# Patient Record
Sex: Female | Born: 1955 | Race: White | Hispanic: No | Marital: Married | Smoking: Former smoker
Health system: Southern US, Community
[De-identification: ages and names within clinical notes are randomized; demographics above are authoritative.]

## PROBLEM LIST (undated history)

## (undated) DIAGNOSIS — L292 Pruritus vulvae: Secondary | ICD-10-CM

## (undated) DIAGNOSIS — K222 Esophageal obstruction: Secondary | ICD-10-CM

## (undated) DIAGNOSIS — K760 Fatty (change of) liver, not elsewhere classified: Secondary | ICD-10-CM

## (undated) DIAGNOSIS — Z8489 Family history of other specified conditions: Secondary | ICD-10-CM

## (undated) DIAGNOSIS — E669 Obesity, unspecified: Secondary | ICD-10-CM

## (undated) DIAGNOSIS — I10 Essential (primary) hypertension: Secondary | ICD-10-CM

## (undated) DIAGNOSIS — Z720 Tobacco use: Secondary | ICD-10-CM

## (undated) DIAGNOSIS — K219 Gastro-esophageal reflux disease without esophagitis: Secondary | ICD-10-CM

## (undated) DIAGNOSIS — E785 Hyperlipidemia, unspecified: Secondary | ICD-10-CM

## (undated) DIAGNOSIS — N952 Postmenopausal atrophic vaginitis: Secondary | ICD-10-CM

## (undated) DIAGNOSIS — M352 Behcet's disease: Secondary | ICD-10-CM

## (undated) HISTORY — DX: Behcet's disease: M35.2

## (undated) HISTORY — PX: ABDOMINAL HYSTERECTOMY: SHX81

## (undated) HISTORY — DX: Esophageal obstruction: K22.2

## (undated) HISTORY — DX: Gastro-esophageal reflux disease without esophagitis: K21.9

## (undated) HISTORY — PX: BACK SURGERY: SHX140

## (undated) HISTORY — DX: Pruritus vulvae: L29.2

## (undated) HISTORY — DX: Postmenopausal atrophic vaginitis: N95.2

## (undated) HISTORY — DX: Tobacco use: Z72.0

---

## 2005-07-29 ENCOUNTER — Ambulatory Visit: Payer: Self-pay | Admitting: Podiatry

## 2010-09-09 ENCOUNTER — Ambulatory Visit: Payer: Self-pay | Admitting: Gastroenterology

## 2011-04-09 ENCOUNTER — Ambulatory Visit: Payer: Self-pay | Admitting: Family Medicine

## 2011-10-15 ENCOUNTER — Ambulatory Visit: Payer: Self-pay | Admitting: Family Medicine

## 2011-10-25 ENCOUNTER — Ambulatory Visit: Payer: Self-pay

## 2012-04-14 ENCOUNTER — Ambulatory Visit: Payer: Self-pay | Admitting: Family Medicine

## 2012-10-22 ENCOUNTER — Ambulatory Visit: Payer: Self-pay | Admitting: Orthopedic Surgery

## 2013-02-25 ENCOUNTER — Other Ambulatory Visit: Payer: Self-pay | Admitting: Neurosurgery

## 2013-02-25 DIAGNOSIS — M5124 Other intervertebral disc displacement, thoracic region: Secondary | ICD-10-CM

## 2013-02-28 ENCOUNTER — Ambulatory Visit
Admission: RE | Admit: 2013-02-28 | Discharge: 2013-02-28 | Disposition: A | Discharge status: Home / Self Care | Payer: Commercial Managed Care - PPO | Source: Ambulatory Visit | Attending: Neurosurgery | Admitting: Neurosurgery

## 2013-02-28 VITALS — BP 107/57 | HR 55 | Ht 62.0 in | Wt 225.0 lb

## 2013-02-28 DIAGNOSIS — M5124 Other intervertebral disc displacement, thoracic region: Secondary | ICD-10-CM

## 2013-02-28 MED ORDER — ONDANSETRON HCL 4 MG/2ML IJ SOLN
4.0000 mg | Freq: Once | INTRAMUSCULAR | Status: AC
  Administered 2013-02-28: 4 mg via INTRAMUSCULAR

## 2013-02-28 MED ORDER — DIAZEPAM 5 MG PO TABS
10.0000 mg | ORAL_TABLET | Freq: Once | ORAL | Status: AC
  Administered 2013-02-28: 10 mg via ORAL

## 2013-02-28 MED ORDER — MEPERIDINE HCL 100 MG/ML IJ SOLN
75.0000 mg | Freq: Once | INTRAMUSCULAR | Status: AC
  Administered 2013-02-28: 75 mg via INTRAMUSCULAR

## 2013-02-28 MED ORDER — IOHEXOL 300 MG/ML  SOLN
10.0000 mL | Freq: Once | INTRAMUSCULAR | Status: AC | PRN
  Administered 2013-02-28: 10 mL via INTRATHECAL

## 2013-03-10 ENCOUNTER — Other Ambulatory Visit: Payer: Self-pay | Admitting: Neurosurgery

## 2013-03-10 ENCOUNTER — Encounter (HOSPITAL_COMMUNITY): Payer: Self-pay | Admitting: Pharmacy Technician

## 2013-03-11 ENCOUNTER — Encounter (HOSPITAL_COMMUNITY): Payer: Self-pay

## 2013-03-11 ENCOUNTER — Encounter (HOSPITAL_COMMUNITY)
Admission: RE | Admit: 2013-03-11 | Discharge: 2013-03-11 | Disposition: A | Discharge status: Home / Self Care | Payer: Commercial Managed Care - PPO | Source: Ambulatory Visit | Attending: Anesthesiology | Admitting: Anesthesiology

## 2013-03-11 ENCOUNTER — Encounter (HOSPITAL_COMMUNITY)
Admission: RE | Admit: 2013-03-11 | Discharge: 2013-03-11 | Disposition: A | Discharge status: Home / Self Care | Payer: Commercial Managed Care - PPO | Source: Ambulatory Visit | Attending: Neurosurgery | Admitting: Neurosurgery

## 2013-03-11 HISTORY — DX: Essential (primary) hypertension: I10

## 2013-03-11 HISTORY — DX: Family history of other specified conditions: Z84.89

## 2013-03-11 HISTORY — DX: Hyperlipidemia, unspecified: E78.5

## 2013-03-11 LAB — CBC WITH DIFFERENTIAL/PLATELET
HCT: 43.7 % (ref 36.0–46.0)
Hemoglobin: 14.8 g/dL (ref 12.0–15.0)
Lymphs Abs: 3.6 10*3/uL (ref 0.7–4.0)
MCH: 31.2 pg (ref 26.0–34.0)
MCHC: 33.9 g/dL (ref 30.0–36.0)
Monocytes Absolute: 0.5 10*3/uL (ref 0.1–1.0)
Monocytes Relative: 5 % (ref 3–12)
Neutro Abs: 4.9 10*3/uL (ref 1.7–7.7)
Neutrophils Relative %: 52 % (ref 43–77)
RBC: 4.74 MIL/uL (ref 3.87–5.11)

## 2013-03-11 LAB — BASIC METABOLIC PANEL
BUN: 7 mg/dL (ref 6–23)
Chloride: 105 mEq/L (ref 96–112)
GFR calc non Af Amer: >90 mL/min (ref >90)
Glucose, Bld: 94 mg/dL (ref 70–99)
Potassium: 4.4 mEq/L (ref 3.5–5.1)
Sodium: 142 mEq/L (ref 135–145)

## 2013-03-11 MED ORDER — VANCOMYCIN HCL IN DEXTROSE 1-5 GM/200ML-% IV SOLN: 1000.0000 mg | INTRAVENOUS | Status: DC

## 2013-03-11 NOTE — Pre-Procedure Instructions (Signed)
Amyia Lodwick  03/11/2013   Your procedure is scheduled on: Monday, May 12th.  Report to [facility] at 8:55 AM.  Call this number if you have problems the morning of surgery: [phone]   Remember:   Do not eat food or drink liquids after midnight.   Take these medicines the morning of surgery with A SIP OF WATER: None   Do not wear jewelry, make-up or nail polish.  Do not wear lotions, powders, or perfumes. You may wear deodorant.  Do not shave 48 hours prior to surgery.   Do not bring valuables to the hospital.  Contacts, dentures or bridgework may not be worn into surgery.  Leave suitcase in the car. After surgery it may be brought to your room.  For patients admitted to the hospital, checkout time is 11:00 AM the day of  discharge.   Patients discharged the day of surgery will not be allowed to drive  home.  Name and phone number of your driver:-  Special Instructions: Shower using CHG 2 nights before surgery and the night before surgery.  If you shower the day of surgery use CHG.  Use special wash - you have one bottle of CHG for all showers.  You should use approximately 1/3 of the bottle for each shower.   Please read over the following fact sheets that you were given: Pain Booklet, Coughing and Deep Breathing and Surgical Site Infection Prevention

## 2013-03-14 ENCOUNTER — Encounter (HOSPITAL_COMMUNITY): Payer: Self-pay | Admitting: Anesthesiology

## 2013-03-14 ENCOUNTER — Ambulatory Visit (HOSPITAL_COMMUNITY): Payer: Commercial Managed Care - PPO

## 2013-03-14 ENCOUNTER — Other Ambulatory Visit: Payer: Self-pay | Admitting: Neurosurgery

## 2013-03-14 ENCOUNTER — Encounter (HOSPITAL_COMMUNITY): Payer: Self-pay | Admitting: Certified Registered Nurse Anesthetist

## 2013-03-14 ENCOUNTER — Observation Stay (HOSPITAL_COMMUNITY)
Admission: RE | Admit: 2013-03-14 | Discharge: 2013-03-15 | Disposition: A | Discharge status: Home / Self Care | Payer: Commercial Managed Care - PPO | Source: Ambulatory Visit | Attending: Neurosurgery | Admitting: Neurosurgery

## 2013-03-14 ENCOUNTER — Encounter (HOSPITAL_COMMUNITY)
Admission: RE | Disposition: A | Discharge status: Home / Self Care | Payer: Self-pay | Source: Ambulatory Visit | Attending: Neurosurgery

## 2013-03-14 ENCOUNTER — Ambulatory Visit (HOSPITAL_COMMUNITY): Payer: Commercial Managed Care - PPO | Admitting: Certified Registered Nurse Anesthetist

## 2013-03-14 DIAGNOSIS — Z01810 Encounter for preprocedural cardiovascular examination: Secondary | ICD-10-CM | POA: Insufficient documentation

## 2013-03-14 DIAGNOSIS — M5124 Other intervertebral disc displacement, thoracic region: Principal | ICD-10-CM | POA: Insufficient documentation

## 2013-03-14 DIAGNOSIS — Z01818 Encounter for other preprocedural examination: Secondary | ICD-10-CM | POA: Insufficient documentation

## 2013-03-14 DIAGNOSIS — E785 Hyperlipidemia, unspecified: Secondary | ICD-10-CM | POA: Insufficient documentation

## 2013-03-14 DIAGNOSIS — Z01812 Encounter for preprocedural laboratory examination: Secondary | ICD-10-CM | POA: Insufficient documentation

## 2013-03-14 DIAGNOSIS — I10 Essential (primary) hypertension: Secondary | ICD-10-CM | POA: Insufficient documentation

## 2013-03-14 HISTORY — PX: THORACIC DISCECTOMY: SHX6113

## 2013-03-14 SURGERY — THORACIC DISCECTOMY
Anesthesia: General | Site: Back | Laterality: Right | Wound class: Clean

## 2013-03-14 MED ORDER — DEXAMETHASONE SODIUM PHOSPHATE 10 MG/ML IJ SOLN: 10.0000 mg | INTRAMUSCULAR | Status: DC

## 2013-03-14 MED ORDER — KETOROLAC TROMETHAMINE 0.5 % OP SOLN
1.0000 [drp] | Freq: Three times a day (TID) | OPHTHALMIC | Status: DC | PRN
  Administered 2013-03-14: 1 [drp] via OPHTHALMIC
  Filled 2013-03-14 (×2): qty 3

## 2013-03-14 MED ORDER — VANCOMYCIN HCL IN DEXTROSE 1-5 GM/200ML-% IV SOLN
INTRAVENOUS | Status: AC
  Administered 2013-03-14: 1000 mg via INTRAVENOUS
  Filled 2013-03-14: qty 200

## 2013-03-14 MED ORDER — LIDOCAINE HCL 4 % MT SOLN
OROMUCOSAL | Status: DC | PRN
  Administered 2013-03-14: 4 mL via TOPICAL

## 2013-03-14 MED ORDER — PROPOFOL 10 MG/ML IV BOLUS
INTRAVENOUS | Status: DC | PRN
  Administered 2013-03-14: 150 mg via INTRAVENOUS

## 2013-03-14 MED ORDER — KETOROLAC TROMETHAMINE 30 MG/ML IJ SOLN
INTRAMUSCULAR | Status: DC | PRN
  Administered 2013-03-14: 30 mg via INTRAVENOUS

## 2013-03-14 MED ORDER — FENTANYL CITRATE 0.05 MG/ML IJ SOLN
INTRAMUSCULAR | Status: DC | PRN
  Administered 2013-03-14: 50 ug via INTRAVENOUS
  Administered 2013-03-14: 100 ug via INTRAVENOUS
  Administered 2013-03-14 (×4): 50 ug via INTRAVENOUS

## 2013-03-14 MED ORDER — AMLODIPINE BESYLATE 5 MG PO TABS
5.0000 mg | ORAL_TABLET | Freq: Every day | ORAL | Status: DC
  Administered 2013-03-14: 5 mg via ORAL
  Filled 2013-03-14 (×2): qty 1

## 2013-03-14 MED ORDER — MENTHOL 3 MG MT LOZG: 1.0000 | LOZENGE | OROMUCOSAL | Status: DC | PRN

## 2013-03-14 MED ORDER — NEOSTIGMINE METHYLSULFATE 1 MG/ML IJ SOLN
INTRAMUSCULAR | Status: DC | PRN
  Administered 2013-03-14: 5 mg via INTRAVENOUS

## 2013-03-14 MED ORDER — ARTIFICIAL TEARS OP OINT
TOPICAL_OINTMENT | OPHTHALMIC | Status: DC | PRN
  Administered 2013-03-14: 1 via OPHTHALMIC

## 2013-03-14 MED ORDER — SODIUM CHLORIDE 0.9 % IV SOLN
INTRAVENOUS | Status: AC
  Filled 2013-03-14: qty 500

## 2013-03-14 MED ORDER — ZOLPIDEM TARTRATE 5 MG PO TABS: 5.0000 mg | ORAL_TABLET | Freq: Every evening | ORAL | Status: DC | PRN

## 2013-03-14 MED ORDER — CYCLOBENZAPRINE HCL 10 MG PO TABS
10.0000 mg | ORAL_TABLET | Freq: Three times a day (TID) | ORAL | Status: DC | PRN
  Administered 2013-03-14 (×2): 10 mg via ORAL
  Filled 2013-03-14: qty 1

## 2013-03-14 MED ORDER — OXYCODONE-ACETAMINOPHEN 5-325 MG PO TABS: 1.0000 | ORAL_TABLET | ORAL | Status: DC | PRN

## 2013-03-14 MED ORDER — ONDANSETRON HCL 4 MG/2ML IJ SOLN
4.0000 mg | INTRAMUSCULAR | Status: DC | PRN
  Administered 2013-03-14: 4 mg via INTRAVENOUS
  Filled 2013-03-14: qty 2

## 2013-03-14 MED ORDER — LACTATED RINGERS IV SOLN
INTRAVENOUS | Status: DC | PRN
  Administered 2013-03-14 (×2): via INTRAVENOUS

## 2013-03-14 MED ORDER — ONDANSETRON HCL 4 MG/2ML IJ SOLN
INTRAMUSCULAR | Status: DC | PRN
  Administered 2013-03-14: 4 mg via INTRAVENOUS

## 2013-03-14 MED ORDER — AMLODIPINE BESY-BENAZEPRIL HCL 5-20 MG PO CAPS: 1.0000 | ORAL_CAPSULE | Freq: Every day | ORAL | Status: DC

## 2013-03-14 MED ORDER — MIDAZOLAM HCL 5 MG/5ML IJ SOLN
INTRAMUSCULAR | Status: DC | PRN
  Administered 2013-03-14: 1 mg via INTRAVENOUS

## 2013-03-14 MED ORDER — ACETAMINOPHEN 325 MG PO TABS: 650.0000 mg | ORAL_TABLET | ORAL | Status: DC | PRN

## 2013-03-14 MED ORDER — GLYCOPYRROLATE 0.2 MG/ML IJ SOLN
INTRAMUSCULAR | Status: DC | PRN
  Administered 2013-03-14: .8 mg via INTRAVENOUS

## 2013-03-14 MED ORDER — SODIUM CHLORIDE 0.9 % IJ SOLN: 3.0000 mL | INTRAMUSCULAR | Status: DC | PRN

## 2013-03-14 MED ORDER — CYCLOBENZAPRINE HCL 10 MG PO TABS
ORAL_TABLET | ORAL | Status: AC
  Filled 2013-03-14: qty 1

## 2013-03-14 MED ORDER — BENAZEPRIL HCL 20 MG PO TABS
20.0000 mg | ORAL_TABLET | Freq: Every day | ORAL | Status: DC
  Administered 2013-03-14: 20 mg via ORAL
  Filled 2013-03-14 (×2): qty 1

## 2013-03-14 MED ORDER — DEXAMETHASONE SODIUM PHOSPHATE 10 MG/ML IJ SOLN
INTRAMUSCULAR | Status: AC
  Administered 2013-03-14: 10 mg via INTRAVENOUS
  Filled 2013-03-14: qty 1

## 2013-03-14 MED ORDER — ROCURONIUM BROMIDE 100 MG/10ML IV SOLN
INTRAVENOUS | Status: DC | PRN
  Administered 2013-03-14 (×3): 10 mg via INTRAVENOUS
  Administered 2013-03-14: 40 mg via INTRAVENOUS
  Administered 2013-03-14: 10 mg via INTRAVENOUS

## 2013-03-14 MED ORDER — THROMBIN 20000 UNITS EX SOLR
CUTANEOUS | Status: DC | PRN
  Administered 2013-03-14: 13:00:00 via TOPICAL

## 2013-03-14 MED ORDER — HYDROCODONE-ACETAMINOPHEN 5-325 MG PO TABS
1.0000 | ORAL_TABLET | ORAL | Status: DC | PRN
Start: 2013-03-14 — End: 2013-03-15
  Administered 2013-03-14 – 2013-03-15 (×3): 2 via ORAL
  Filled 2013-03-14 (×2): qty 2

## 2013-03-14 MED ORDER — SENNA 8.6 MG PO TABS
1.0000 | ORAL_TABLET | Freq: Two times a day (BID) | ORAL | Status: DC
  Administered 2013-03-14: 8.6 mg via ORAL
  Filled 2013-03-14 (×3): qty 1

## 2013-03-14 MED ORDER — HYDROMORPHONE HCL PF 1 MG/ML IJ SOLN
0.2500 mg | INTRAMUSCULAR | Status: DC | PRN
  Administered 2013-03-14: 0.5 mg via INTRAVENOUS

## 2013-03-14 MED ORDER — THROMBIN 5000 UNITS EX SOLR
CUTANEOUS | Status: DC | PRN
  Administered 2013-03-14 (×2): 5000 [IU] via TOPICAL

## 2013-03-14 MED ORDER — HYDROCODONE-ACETAMINOPHEN 5-325 MG PO TABS
ORAL_TABLET | ORAL | Status: AC
  Filled 2013-03-14: qty 2

## 2013-03-14 MED ORDER — HYDROMORPHONE HCL PF 1 MG/ML IJ SOLN
INTRAMUSCULAR | Status: AC
  Filled 2013-03-14: qty 1

## 2013-03-14 MED ORDER — ACETAMINOPHEN 650 MG RE SUPP: 650.0000 mg | RECTAL | Status: DC | PRN

## 2013-03-14 MED ORDER — PHENOL 1.4 % MT LIQD: 1.0000 | OROMUCOSAL | Status: DC | PRN

## 2013-03-14 MED ORDER — HEMOSTATIC AGENTS (NO CHARGE) OPTIME
TOPICAL | Status: DC | PRN
  Administered 2013-03-14: 1 via TOPICAL

## 2013-03-14 MED ORDER — SODIUM CHLORIDE 0.9 % IR SOLN
Status: DC | PRN
  Administered 2013-03-14: 12:00:00

## 2013-03-14 MED ORDER — SODIUM CHLORIDE 0.9 % IJ SOLN
3.0000 mL | Freq: Two times a day (BID) | INTRAMUSCULAR | Status: DC
  Administered 2013-03-14: 3 mL via INTRAVENOUS

## 2013-03-14 MED ORDER — BUPIVACAINE HCL (PF) 0.25 % IJ SOLN
INTRAMUSCULAR | Status: DC | PRN
  Administered 2013-03-14: 30 mL

## 2013-03-14 MED ORDER — BACITRACIN 50000 UNITS IM SOLR
INTRAMUSCULAR | Status: AC
  Filled 2013-03-14: qty 1

## 2013-03-14 MED ORDER — 0.9 % SODIUM CHLORIDE (POUR BTL) OPTIME
TOPICAL | Status: DC | PRN
  Administered 2013-03-14: 1000 mL

## 2013-03-14 MED ORDER — HYDROMORPHONE HCL PF 1 MG/ML IJ SOLN
0.5000 mg | INTRAMUSCULAR | Status: DC | PRN
  Administered 2013-03-14: 1 mg via INTRAVENOUS
  Filled 2013-03-14: qty 1

## 2013-03-14 MED ORDER — SIMVASTATIN 20 MG PO TABS
20.0000 mg | ORAL_TABLET | Freq: Every day | ORAL | Status: DC
  Administered 2013-03-14: 20 mg via ORAL
  Filled 2013-03-14 (×2): qty 1

## 2013-03-14 MED ORDER — LIDOCAINE HCL (CARDIAC) 20 MG/ML IV SOLN
INTRAVENOUS | Status: DC | PRN
  Administered 2013-03-14: 50 mg via INTRAVENOUS

## 2013-03-14 MED ORDER — VANCOMYCIN HCL IN DEXTROSE 1-5 GM/200ML-% IV SOLN
1000.0000 mg | Freq: Once | INTRAVENOUS | Status: AC
  Administered 2013-03-14: 1000 mg via INTRAVENOUS
  Filled 2013-03-14: qty 200

## 2013-03-14 MED ORDER — ALUM & MAG HYDROXIDE-SIMETH 200-200-20 MG/5ML PO SUSP: 30.0000 mL | Freq: Four times a day (QID) | ORAL | Status: DC | PRN

## 2013-03-14 MED ORDER — ALBUMIN HUMAN 5 % IV SOLN
INTRAVENOUS | Status: DC | PRN
  Administered 2013-03-14: 13:00:00 via INTRAVENOUS

## 2013-03-14 MED ORDER — HYDROCHLOROTHIAZIDE 25 MG PO TABS
25.0000 mg | ORAL_TABLET | Freq: Every day | ORAL | Status: DC
  Filled 2013-03-14 (×2): qty 1

## 2013-03-14 SURGICAL SUPPLY — 59 items
BAG DECANTER FOR FLEXI CONT (MISCELLANEOUS) ×2 IMPLANT
BENZOIN TINCTURE PRP APPL 2/3 (GAUZE/BANDAGES/DRESSINGS) ×2 IMPLANT
BLADE SURG ROTATE 9660 (MISCELLANEOUS) IMPLANT
BRUSH SCRUB EZ PLAIN DRY (MISCELLANEOUS) ×2 IMPLANT
BUR CUTTER 7.0 ROUND (BURR) ×2 IMPLANT
BUR MATCHSTICK NEURO 3.0 LAGG (BURR) IMPLANT
BUR MATCHSTICK NEURO 3.0X3.8 (BURR) ×2 IMPLANT
CANISTER SUCTION 2500CC (MISCELLANEOUS) ×2 IMPLANT
CLOTH BEACON ORANGE TIMEOUT ST (SAFETY) ×2 IMPLANT
CONT SPEC 4OZ CLIKSEAL STRL BL (MISCELLANEOUS) ×2 IMPLANT
DECANTER SPIKE VIAL GLASS SM (MISCELLANEOUS) IMPLANT
DERMABOND ADHESIVE PROPEN (GAUZE/BANDAGES/DRESSINGS) ×1
DERMABOND ADVANCED (GAUZE/BANDAGES/DRESSINGS)
DERMABOND ADVANCED .7 DNX12 (GAUZE/BANDAGES/DRESSINGS) IMPLANT
DERMABOND ADVANCED .7 DNX6 (GAUZE/BANDAGES/DRESSINGS) ×1 IMPLANT
DRAPE LAPAROTOMY 100X72X124 (DRAPES) ×2 IMPLANT
DRAPE MICROSCOPE LEICA (MISCELLANEOUS) ×2 IMPLANT
DRAPE MICROSCOPE ZEISS OPMI (DRAPES) IMPLANT
DRAPE POUCH INSTRU U-SHP 10X18 (DRAPES) ×2 IMPLANT
DRAPE PROXIMA HALF (DRAPES) ×2 IMPLANT
DRAPE SURG 17X23 STRL (DRAPES) ×8 IMPLANT
ELECT REM PT RETURN 9FT ADLT (ELECTROSURGICAL) ×2
ELECTRODE REM PT RTRN 9FT ADLT (ELECTROSURGICAL) ×1 IMPLANT
GAUZE SPONGE 4X4 16PLY XRAY LF (GAUZE/BANDAGES/DRESSINGS) IMPLANT
GLOVE BIOGEL PI IND STRL 7.5 (GLOVE) ×2 IMPLANT
GLOVE BIOGEL PI IND STRL 8.5 (GLOVE) ×2 IMPLANT
GLOVE BIOGEL PI INDICATOR 7.5 (GLOVE) ×2
GLOVE BIOGEL PI INDICATOR 8.5 (GLOVE) ×2
GLOVE ECLIPSE 8.5 STRL (GLOVE) IMPLANT
GLOVE EXAM NITRILE LRG STRL (GLOVE) IMPLANT
GLOVE EXAM NITRILE MD LF STRL (GLOVE) IMPLANT
GLOVE EXAM NITRILE XL STR (GLOVE) IMPLANT
GLOVE EXAM NITRILE XS STR PU (GLOVE) IMPLANT
GLOVE SURG SS PI 7.0 STRL IVOR (GLOVE) ×6 IMPLANT
GLOVE SURG SS PI 8.0 STRL IVOR (GLOVE) ×6 IMPLANT
GLOVE SURG SS PI 8.5 STRL IVOR (GLOVE) ×3
GLOVE SURG SS PI 8.5 STRL STRW (GLOVE) ×3 IMPLANT
GOWN BRE IMP SLV AUR LG STRL (GOWN DISPOSABLE) IMPLANT
GOWN BRE IMP SLV AUR XL STRL (GOWN DISPOSABLE) ×8 IMPLANT
GOWN STRL REIN 2XL LVL4 (GOWN DISPOSABLE) ×4 IMPLANT
KIT BASIN OR (CUSTOM PROCEDURE TRAY) ×2 IMPLANT
KIT ROOM TURNOVER OR (KITS) ×2 IMPLANT
NEEDLE HYPO 22GX1.5 SAFETY (NEEDLE) ×2 IMPLANT
NEEDLE SPNL 22GX3.5 QUINCKE BK (NEEDLE) ×2 IMPLANT
NS IRRIG 1000ML POUR BTL (IV SOLUTION) ×2 IMPLANT
PACK LAMINECTOMY NEURO (CUSTOM PROCEDURE TRAY) ×2 IMPLANT
PAD ARMBOARD 7.5X6 YLW CONV (MISCELLANEOUS) ×6 IMPLANT
RUBBERBAND STERILE (MISCELLANEOUS) ×4 IMPLANT
SPONGE GAUZE 4X4 12PLY (GAUZE/BANDAGES/DRESSINGS) ×2 IMPLANT
SPONGE SURGIFOAM ABS GEL 100 (HEMOSTASIS) ×2 IMPLANT
SPONGE SURGIFOAM ABS GEL SZ50 (HEMOSTASIS) ×2 IMPLANT
STRIP CLOSURE SKIN 1/2X4 (GAUZE/BANDAGES/DRESSINGS) ×2 IMPLANT
SUT VIC AB 2-0 CT1 18 (SUTURE) ×4 IMPLANT
SUT VIC AB 3-0 SH 8-18 (SUTURE) ×6 IMPLANT
SYR 20ML ECCENTRIC (SYRINGE) ×2 IMPLANT
TAPE CLOTH SURG 4X10 WHT LF (GAUZE/BANDAGES/DRESSINGS) ×2 IMPLANT
TOWEL OR 17X24 6PK STRL BLUE (TOWEL DISPOSABLE) ×2 IMPLANT
TOWEL OR 17X26 10 PK STRL BLUE (TOWEL DISPOSABLE) ×2 IMPLANT
WATER STERILE IRR 1000ML POUR (IV SOLUTION) ×2 IMPLANT

## 2013-03-14 NOTE — Transfer of Care (Signed)
Immediate Anesthesia Transfer of Care Note  Patient: Sara Rhodes  Procedure(s) Performed: Procedure(s) with comments: THORACIC DISCECTOMY (Right) - Thoracic four-five and thoracic nine-ten laminotomy with transpedicular microdiskectomy  Patient Location: PACU  Anesthesia Type:General  Level of Consciousness: awake, alert  and oriented  Airway & Oxygen Therapy: Patient Spontanous Breathing and Patient connected to face mask oxygen  Post-op Assessment: Report given to PACU RN  Post vital signs: Reviewed and stable  Complications: No apparent anesthesia complications

## 2013-03-14 NOTE — Op Note (Signed)
Date of procedure: 03/14/2013  Date of dictation: Same  Service: Neurosurgery  Preoperative diagnosis: Right T4-T5 herniated pulposus with radiculopathy and right T9-T10 herniated nucleus pulposus with radiculopathy.  Postoperative diagnosis: Same  Procedure Name: Right T4-T5 laminotomy and transpedicular microdiscectomy.  Right T9-T10 laminotomy and transpedicular microdiscectomy  Surgeon:Graceanne Guin A.Batya Citron, M.D.  Asst. Surgeon: Danielle Dess  Anesthesia: General  Indication: 57 year old female with severe dorsal chest pain which is radicular in nature different and distinct nerve root distributions. Patient has a significant right-sided C4-5 disc herniation with compression the right lateral spinal cord and C5 nerve root. Patient a right-sided T9-10 disc herniation extending into the neural foramen causing compression the exiting T9 and as well as the T10 nerve roots. Patient has failed conservative management and presents now for 2 level microdiscectomy in hopes of improving her symptoms.  Operative note: After induction of anesthesia, patient positioned prone onto Wilson frame and appropriately padded. Thoracic region prepped and draped sterilely. Incision made overlying T4-5 and T9-10. Subperiosteal dissection performed on the right side exposing the lamina and facet joints of T4-5 and T9-10. Marker placed first on the pedicle of T4 on the right and level confirmed. Approach was then made to the interspace call from this and the laminotomy was performed on the right side at T4-5. Extensive medial facetectomies of T4 and T5 were performed. Superior aspect of the T5 pedicle was removed using a high-speed drill. Microscope was used for [employer]. Dissection of the spinal canal. Epidural venous plexus was coagulated and cut. The space and disc herniation were identified. The space and size a 15 blade. Using micropituitary rongeurs and small Epstein curettes the disc herniation were completely resected. The disc  space was entered and all loose her oxygen bistro was moved and interspace. At this point a very thorough discectomy been achieved. There is no evidence of injury to thecal sac or nerve roots. The procedure was performed without necessitating any type of retraction of the thecal sac. Wound is then irrigated out like solution. Gelfoam was placed topically for hemostasis which Ascent be good. Patient then placed at T9-10. X-rays taken and the T10 pedicle was identified. Moving up to the interspace above the T10 pedicle laminotomy was performed using high-speed drill and Kerrison rongeurs. Anterior aspect of lamina of T9 was removed medial aspect to the T9-10 facet joint was removed superior aspect of the T. 10 lamina was removed. Ligament flavum was elevated and resected in piecemeal fashion using Kerrison rongeurs. Epidural reflexes coagulated and cut. Microscope used for microdissection of the spinal canal. The space and disc herniation was again identified. The disc was then incised with a 15 blade. Discectomy performed using pituitary rongeurs and Epstein curettes. All elements the disc herniation were completely resected. All loose are obvious he jammed his peers moved interspace. This point a very thorough discectomy been achieved. There was no evidence of injury to thecal sac and nerve roots. Wound is then irrigated and bike solution. Gelfoam was placed topically for hemostasis which then the beaded. Microscope and retractor system were removed. Hemostasis of the muscle was achieved with electrocautery. Wounds and close in layers with Vicryl sutures. Steri-Strips and sterile dressings were applied. There were no apparent complications. Patient tolerated the procedure well and she returns to the recovery room postop.

## 2013-03-14 NOTE — Anesthesia Postprocedure Evaluation (Signed)
  Anesthesia Post-op Note  Patient: Sara Rhodes  Procedure(s) Performed: Procedure(s) with comments: THORACIC DISCECTOMY (Right) - Thoracic four-five and thoracic nine-ten laminotomy with transpedicular microdiskectomy  Patient Location: PACU  Anesthesia Type:General  Level of Consciousness: awake  Airway and Oxygen Therapy: Patient Spontanous Breathing  Post-op Pain: mild  Post-op Assessment: Post-op Vital signs reviewed  Post-op Vital Signs: Reviewed  Complications: No apparent anesthesia complications

## 2013-03-14 NOTE — Brief Op Note (Signed)
03/14/2013  2:29 PM  PATIENT:  Karin Lieu  57 y.o. female  PRE-OPERATIVE DIAGNOSIS:  Herniated Nucleus Pulposus  POST-OPERATIVE DIAGNOSIS:  Herniated Nucleus Pulposus  PROCEDURE:  Procedure(s) with comments: THORACIC DISCECTOMY (Right) - Thoracic four-five and thoracic nine-ten laminotomy with transpedicular microdiskectomy  SURGEON:  Surgeon(s) and Role:    * Temple Pacini, MD - Primary  PHYSICIAN ASSISTANT:   ASSISTANTS: Elsner   ANESTHESIA:   general  EBL:  Total I/O In: 1250 [I.V.:1000; IV Piggyback:250] Out: 150 [Blood:150]  BLOOD ADMINISTERED:none  DRAINS: none   LOCAL MEDICATIONS USED:  MARCAINE     SPECIMEN:  No Specimen  DISPOSITION OF SPECIMEN:  N/A  COUNTS:  YES  TOURNIQUET:  * No tourniquets in log *  DICTATION: .Dragon Dictation  PLAN OF CARE: Admit for overnight observation  PATIENT DISPOSITION:  PACU - hemodynamically stable.   Delay start of Pharmacological VTE agent (>24hrs) due to surgical blood loss or risk of bleeding: yes

## 2013-03-14 NOTE — Anesthesia Preprocedure Evaluation (Addendum)
Anesthesia Evaluation  Patient identified by MRN, date of birth, ID band  Reviewed: Allergy & Precautions, H&P , NPO status , Patient's Chart, lab work & pertinent test results, reviewed documented beta blocker date and time   History of Anesthesia Complications (+) Family history of anesthesia reaction  Airway Mallampati: II TM Distance: >3 FB   Mouth opening: Limited Mouth Opening  Dental   Pulmonary neg pulmonary ROS,  breath sounds clear to auscultation  Pulmonary exam normal       Cardiovascular hypertension, Pt. on medications and Pt. on home beta blockers Rhythm:Regular Rate:Normal     Neuro/Psych    GI/Hepatic negative GI ROS,   Endo/Other  negative endocrine ROS  Renal/GU negative Renal ROS     Musculoskeletal   Abdominal Normal abdominal exam  (+)   Peds  Hematology   Anesthesia Other Findings   Reproductive/Obstetrics                          Anesthesia Physical Anesthesia Plan  ASA: III  Anesthesia Plan: General   Post-op Pain Management:    Induction: Intravenous  Airway Management Planned: Oral ETT  Additional Equipment:   Intra-op Plan:   Post-operative Plan: Extubation in OR  Informed Consent: I have reviewed the patients History and Physical, chart, labs and discussed the procedure including the risks, benefits and alternatives for the proposed anesthesia with the patient or authorized representative who has indicated his/her understanding and acceptance.   Dental advisory given  Plan Discussed with: CRNA, Anesthesiologist and Surgeon  Anesthesia Plan Comments:        Anesthesia Quick Evaluation

## 2013-03-14 NOTE — H&P (Signed)
Sara Rhodes is an 57 y.o. female.   Chief Complaint: Back pain HPI: 57 year old female with severe posterior thoracic pain into separate distributions. The first a radiates around her right scapular region and into her right sub-axillary region. It is quite severe and is failed conservative management. Patient also has severe lower thoracic pain which radiates into her lower right chest along the inferior aspect of her costal border. It is associated with some occasional numbness. She has no lower trimming function. She has no bowel or bladder dysfunction. She is quite miserable with her current pain level.  Workup demonstrates evidence of significant right-sided T4-T5 disc herniation with compression of the right lateral spinal cord and exiting right T5 nerve root. Patient also has a significant right-sided T9-T10 disc herniation with compression the exiting T10 nerve root. Patient been casted as to her options. She sided proceed with a 2 level posterior thoracic microdiscectomy in hopes of improving her symptoms.  Past Medical History  Diagnosis Date  . Hypertension   . Family history of anesthesia complication     N/V daughter  . Hyperlipemia     Past Surgical History  Procedure Laterality Date  . Abdominal hysterectomy      total    History reviewed. No pertinent family history. Social History:  reports that she has been smoking Cigarettes.  She has a 5 pack-year smoking history. She has never used smokeless tobacco. Her alcohol and drug histories are not on file.  Allergies:  Allergies  Allergen Reactions  . Latex Itching and Rash  . Penicillins Itching and Rash    Medications Prior to Admission  Medication Sig Dispense Refill  . amLODipine-benazepril (LOTREL) 5-20 MG per capsule Take 1 capsule by mouth at bedtime.      . hydrochlorothiazide (HYDRODIURIL) 25 MG tablet Take 25 mg by mouth daily.      . simvastatin (ZOCOR) 20 MG tablet Take 20 mg by mouth at bedtime.         No results found for this or any previous visit (from the past 48 hour(s)). No results found.  Review of Systems  Constitutional: Negative.   HENT: Negative.   Eyes: Negative.   Respiratory: Negative.   Cardiovascular: Negative.   Gastrointestinal: Negative.   Genitourinary: Negative.   Musculoskeletal: Negative.   Skin: Negative.   Neurological: Negative.   Endo/Heme/Allergies: Negative.   Psychiatric/Behavioral: Negative.     Blood pressure 123/82, pulse 66, temperature 98 F (36.7 C), temperature source Oral, resp. rate 20, SpO2 97.00%. Physical Exam  Constitutional: She is oriented to person, place, and time. She appears well-developed and well-nourished. No distress.  HENT:  Head: Normocephalic and atraumatic.  Right Ear: External ear normal.  Left Ear: External ear normal.  Nose: Nose normal.  Mouth/Throat: Oropharynx is clear and moist.  Eyes: Conjunctivae and EOM are normal. Pupils are equal, round, and reactive to light. Right eye exhibits no discharge. Left eye exhibits no discharge.  Neck: Normal range of motion. Neck supple. No tracheal deviation present. No thyromegaly present.  Cardiovascular: Normal rate, regular rhythm, normal heart sounds and intact distal pulses.  Exam reveals no friction rub.   No murmur heard. Respiratory: Effort normal and breath sounds normal. No respiratory distress. She has no wheezes.  GI: Soft. Bowel sounds are normal. She exhibits no distension. There is no tenderness.  Musculoskeletal: Normal range of motion. She exhibits no edema and no tenderness.  Neurological: She is alert and oriented to person, place, and time. She has  normal reflexes. No cranial nerve deficit. Coordination normal.  Skin: Skin is warm and dry. No rash noted. She is not diaphoretic. No erythema.  Psychiatric: She has a normal mood and affect. Her behavior is normal. Judgment and thought content normal.     Assessment/Plan Right T4/T5 herniated nucleus  pulposus with radiculopathy and right T9-T10 herniated pulposus with radiculopathy. Plan right T4-5 laminotomy and transpedicular microdiscectomy and right T9-T10 laminotomy and transpedicular microdiscectomy. Risks and benefits have been explained. Patient wishes to proceed.  Torie Priebe A 03/14/2013, 11:47 AM

## 2013-03-14 NOTE — Preoperative (Signed)
Beta Blockers   Reason not to administer Beta Blockers:Not Applicable 

## 2013-03-15 ENCOUNTER — Encounter (HOSPITAL_COMMUNITY): Payer: Self-pay | Admitting: Neurosurgery

## 2013-03-15 MED ORDER — CYCLOBENZAPRINE HCL 10 MG PO TABS: 10.0000 mg | ORAL_TABLET | Freq: Three times a day (TID) | ORAL | Status: DC | PRN

## 2013-03-15 MED ORDER — HYDROCODONE-ACETAMINOPHEN 5-325 MG PO TABS: 1.0000 | ORAL_TABLET | ORAL | Status: DC | PRN

## 2013-03-15 NOTE — Progress Notes (Signed)
Pt. Alert and oriented,follows simple instructions, denies pain. Incision area without swelling, redness or S/S of infection. Voiding adequate clear yellow urine. Moving all extremities well and vitals stable and documented. Pt. Discharged home as ordered. Lumbar surgery notes instructions given to patient and family member for home safety and precautions. Pt. and family stated understanding of instructions given. 

## 2013-03-15 NOTE — Discharge Summary (Signed)
Physician Discharge Summary  Patient ID: Sara Rhodes MRN: [id number] DOB/AGE: [birth year] 57 y.o.  Admit date: 03/14/2013 Discharge date: 03/15/2013  Admission Diagnoses:  Discharge Diagnoses:  Principal Problem:   HNP (herniated nucleus pulposus), thoracic   Discharged Condition: good  Hospital Course: Patient admitted the hospital where she underwent uncomplicated 2 level thoracic transpedicular microdiscectomy. Postoperative she is done very well. Preoperative back and rib pain much improved. Neurologically intact ready for discharge home.  Consults:   Significant Diagnostic Studies:   Treatments:   Discharge Exam: Blood pressure 121/66, pulse 61, temperature 98.5 F (36.9 C), temperature source Oral, resp. rate 18, SpO2 96.00%. Awake and alert. Oriented and appropriate. Cranial nerve function intact. Motor and sensory function extremities normal. Wound clean and dry. Chest and abdomen benign.  Disposition: Final discharge disposition not confirmed     Medication List    TAKE these medications       amLODipine-benazepril 5-20 MG per capsule  Commonly known as:  LOTREL  Take 1 capsule by mouth at bedtime.     cyclobenzaprine 10 MG tablet  Commonly known as:  FLEXERIL  Take 1 tablet (10 mg total) by mouth 3 (three) times daily as needed for muscle spasms.     hydrochlorothiazide 25 MG tablet  Commonly known as:  HYDRODIURIL  Take 25 mg by mouth daily.     HYDROcodone-acetaminophen 5-325 MG per tablet  Commonly known as:  NORCO/VICODIN  Take 1-2 tablets by mouth every 4 (four) hours as needed.     simvastatin 20 MG tablet  Commonly known as:  ZOCOR  Take 20 mg by mouth at bedtime.           Follow-up Information   Follow up with Cedrick Partain A, MD. Call in 1 week. (Ask for Lurena Joiner)    Contact information:   [address] [place] [address] [phone]       Signed: [place] Warriner A 03/15/2013, 9:11 AM

## 2013-05-19 ENCOUNTER — Encounter: Payer: Self-pay | Admitting: *Deleted

## 2013-05-30 ENCOUNTER — Ambulatory Visit (INDEPENDENT_AMBULATORY_CARE_PROVIDER_SITE_OTHER): Payer: 59 | Admitting: General Surgery

## 2013-05-30 ENCOUNTER — Encounter: Payer: Self-pay | Admitting: General Surgery

## 2013-05-30 VITALS — BP 130/82 | HR 82 | Resp 14 | Ht 63.0 in | Wt 239.0 lb

## 2013-05-30 DIAGNOSIS — I809 Phlebitis and thrombophlebitis of unspecified site: Secondary | ICD-10-CM | POA: Insufficient documentation

## 2013-05-30 NOTE — Patient Instructions (Addendum)
Follow up with Dr Evette Cristal. Take one coated baby aspirin daily as a preventative.

## 2013-05-30 NOTE — Progress Notes (Signed)
Patient ID: Sara Rhodes, female   DOB: [birth year], 57 y.o.   MRN: [id number]  Chief Complaint  Patient presents with  . Other    bulging veins    HPI Sara Rhodes is a 57 y.o. female  Here for inflamed leg veins. She had back surgery on May 12th and then around the 1st of July is when she first started having symptoms. The right leg is worse than the left. She reports having pain in the right leg with a bruise like area on the ventral surface of the upper leg that started spreading. She was started on a Z-pack and Aleve which seemed to stop the spread. She also has bulging vein in the back of her right knee that can cause discomfort, and a very new area of bulging on the outer area of her left leg that is painful. HPI  Past Medical History  Diagnosis Date  . Hypertension   . Family history of anesthesia complication     N/V daughter  . Hyperlipemia     Past Surgical History  Procedure Laterality Date  . Abdominal hysterectomy      total  . Thoracic discectomy Right 03/14/2013    Procedure: THORACIC DISCECTOMY;  Surgeon: Temple Pacini, MD;  Location: [facility];  Service: Neurosurgery;  Laterality: Right;  Thoracic four-five and thoracic nine-ten laminotomy with transpedicular microdiskectomy    No family history on file.  Social History History  Substance Use Topics  . Smoking status: Former Smoker -- 0.50 packs/day for 10 years    Types: Cigarettes  . Smokeless tobacco: Never Used  . Alcohol Use: No    Allergies  Allergen Reactions  . Latex Itching and Rash  . Penicillins Itching and Rash    Current Outpatient Prescriptions  Medication Sig Dispense Refill  . amLODipine-benazepril (LOTREL) 5-20 MG per capsule Take 1 capsule by mouth at bedtime.      . hydrochlorothiazide (HYDRODIURIL) 25 MG tablet Take 25 mg by mouth daily.      Marland Kitchen HYDROcodone-acetaminophen (NORCO/VICODIN) 5-325 MG per tablet Take 1-2 tablets by mouth every 4 (four) hours as needed.  80 tablet   1  . naproxen sodium (ANAPROX) 220 MG tablet Take 220 mg by mouth 2 (two) times daily with a meal.      . simvastatin (ZOCOR) 20 MG tablet Take 20 mg by mouth at bedtime.       No current facility-administered medications for this visit.    Review of Systems Review of Systems  Constitutional: Negative.   Respiratory: Negative.   Cardiovascular: Negative.     Blood pressure 130/82, pulse 82, resp. rate 14, height 5\' 3"  (1.6 m), weight 239 lb (108.41 kg).  Physical Exam Physical Exam  Constitutional: She is oriented to person, place, and time. She appears well-developed and well-nourished.  Neck: Neck supple.  Cardiovascular: Normal rate, regular rhythm and intact distal pulses.   Pulses:      Femoral pulses are 2+ on the right side, and 2+ on the left side.      Dorsalis pedis pulses are 2+ on the right side, and 2+ on the left side.       Posterior tibial pulses are 2+ on the right side, and 2+ on the left side.  No significant lower extremity edema is appreciated.    Pulmonary/Chest: Effort normal and breath sounds normal.  Lymphadenopathy:    She has no cervical adenopathy.  Neurological: She is alert and oriented to person, place, and  time.  Skin: Skin is warm and dry.     Multiple superficial varicosities on right interior thigh.     Data Reviewed Limited ultrasound of the right lower extremity was completed. The femoral vein is patent without evidence of thrombus. Dilated veins are noted in the saphenofemoral junction.  Assessment    Right lower extremity superficial thrombophlebitis without evidence of progression.     Plan    The patient will benefit from formal evaluation for sclerotherapy/RFA ablation. Arrangements will be made for assessment by Dr. Evette Cristal this regard.        Earline Mayotte 05/31/2013, 6:45 AM   a

## 2013-05-31 ENCOUNTER — Encounter: Payer: Self-pay | Admitting: General Surgery

## 2013-06-02 ENCOUNTER — Encounter: Payer: Self-pay | Admitting: General Surgery

## 2013-06-22 ENCOUNTER — Ambulatory Visit (INDEPENDENT_AMBULATORY_CARE_PROVIDER_SITE_OTHER): Payer: 59 | Admitting: General Surgery

## 2013-06-22 ENCOUNTER — Encounter: Payer: Self-pay | Admitting: General Surgery

## 2013-06-22 VITALS — BP 122/72 | HR 68 | Resp 12 | Ht 62.0 in | Wt 241.0 lb

## 2013-06-22 DIAGNOSIS — I83893 Varicose veins of bilateral lower extremities with other complications: Secondary | ICD-10-CM

## 2013-06-22 NOTE — Patient Instructions (Addendum)
Compression Stockings Compression stockings are elastic stockings that "compress" your legs. This helps to increase blood flow, decrease swelling, and reduces the chance of getting blood clots in your lower legs. Compression stockings are used:  After surgery.  If you have a history of poor circulation.  If you are prone to blood clots.  If you have varicose veins.  If you sit or are bedridden for long periods of time. WEARING COMPRESSION STOCKINGS  Your compression stockings should be worn as instructed by your caregiver.  Wearing the correct stocking size is important. Your caregiver can help measure and fit you to the correct size.  When wearing your stockings, do not allow the stockings to bunch up. This is especially important around your toes or behind your knees. Keep the stockings as smooth as possible.  Do not roll the stockings downward and leave them rolled down. This can form a restrictive band around your legs and can decrease blood flow.  The stockings should be removed once a day for 1 hour or as instructed by your caregiver. When the stockings are taken off, inspect your legs and feet. Look for:  Open sores.  Red spots.  Puffy areas (swelling).  Anything that does not seem normal. IMPORTANT INFORMATION ABOUT COMPRESSION STOCKINGS  The compression stockings should be clean, dry, and in good condition before you put them on.  Do not put lotion on your legs or feet. This makes it harder to put the stockings on.  Change your stockings immediately if they become wet or soiled.  Do not wear stockings that are ripped or torn.  You may hand-wash or put your stockings in the washing machine. Use cold or warm water with mild detergent. Do not bleach your stockings. They may be air-dried or dried in the dryer on low heat.  If you have pain or have a feeling of "pins and needles" in your feet or legs, you may be wearing stockings that are too tight. Call your caregiver  right away. SEEK IMMEDIATE MEDICAL CARE IF:   You have numbness or tingling in your lower legs that does not get better quickly after the stockings are removed.  Your toes or feet become cold and blue.  You develop open sores or have red spots on your legs that do not go away. MAKE SURE YOU:   Understand these instructions.  Will watch your condition.  Will get help right away if you are not doing well or get worse. Document Released: 08/17/2009 Document Revised: 01/12/2012 Document Reviewed: 08/17/2009 [employer] Patient Information 2014 [employer].  

## 2013-06-22 NOTE — Progress Notes (Signed)
Patient ID: Sara Rhodes, female   DOB: [birth year], 57 y.o.   MRN: [id number]  Chief Complaint  Patient presents with  . Follow-up    vascular    HPI Sara Rhodes is a 57 y.o. female.  Patient here today for evaluation of her veins in her legs at the request of Dr Lemar Livings.  States she has bulging vein in the back of her right knee that can cause discomfort, and a very area of bulging on the outer area of her left leg that is painful. Has been bothering her since May 2014. Using Aleve on an as needed basis. States when she walks for a long period of time she has pain in right groin. She does say that her legs are more swollen by the end of the end.  She states she was treated with a Z Pack for phlebitis by Dr Orson Aloe.  HPI  Past Medical History  Diagnosis Date  . Hypertension   . Family history of anesthesia complication     N/V daughter  . Hyperlipemia     Past Surgical History  Procedure Laterality Date  . Abdominal hysterectomy      total  . Thoracic discectomy Right 03/14/2013    Procedure: THORACIC DISCECTOMY;  Surgeon: Temple Pacini, MD;  Location: [facility];  Service: Neurosurgery;  Laterality: Right;  Thoracic four-five and thoracic nine-ten laminotomy with transpedicular microdiskectomy    History reviewed. No pertinent family history.  Social History History  Substance Use Topics  . Smoking status: Former Smoker -- 0.50 packs/day for 10 years    Types: Cigarettes  . Smokeless tobacco: Never Used  . Alcohol Use: No    Allergies  Allergen Reactions  . Latex Itching and Rash  . Penicillins Itching and Rash    Current Outpatient Prescriptions  Medication Sig Dispense Refill  . amLODipine-benazepril (LOTREL) 5-20 MG per capsule Take 1 capsule by mouth at bedtime.      Marland Kitchen aspirin 81 MG tablet Take 81 mg by mouth daily.      . hydrochlorothiazide (HYDRODIURIL) 25 MG tablet Take 25 mg by mouth daily.      . Naproxen Sodium (ALEVE PO) Take 1 tablet by mouth  as needed.      Marland Kitchen omeprazole (PRILOSEC) 20 MG capsule Take 20 mg by mouth daily.      . simvastatin (ZOCOR) 20 MG tablet Take 20 mg by mouth at bedtime.       No current facility-administered medications for this visit.    Review of Systems Review of Systems  Constitutional: Negative.   Respiratory: Negative.   Cardiovascular: Negative.     Blood pressure 122/72, pulse 68, resp. rate 12, height 5\' 2"  (1.575 m), weight 241 lb (109.317 kg).  Physical Exam Physical Exam  Constitutional: She is oriented to person, place, and time. She appears well-developed and well-nourished.  Cardiovascular: Normal rate and regular rhythm.   Pulses:      Dorsalis pedis pulses are 2+ on the right side, and 2+ on the left side.       Posterior tibial pulses are 2+ on the right side, and 2+ on the left side.  varicose veins along lateral aspect of right calf at knee area. Varicose veins on left thigh lateral aspect maxium size 5 mm.   Pulmonary/Chest: Effort normal and breath sounds normal.  Abdominal: Soft. No hernia.  Neurological: She is alert and oriented to person, place, and time.  Skin: Skin is warm and dry.  Data Reviewed none  Assessment    Varicose veins as noted bilateral legs.    Plan    Prescription for thigh length compression hose. Discussed findings and related symptoms with pt. The pt had some pain in right groin area which is not from VV. No hernia or lymphadenopathy noted there.       Yigit Norkus G 06/22/2013, 1:47 PM

## 2013-08-03 ENCOUNTER — Ambulatory Visit: Payer: 59 | Admitting: General Surgery

## 2013-08-17 ENCOUNTER — Ambulatory Visit (INDEPENDENT_AMBULATORY_CARE_PROVIDER_SITE_OTHER): Payer: 59 | Admitting: General Surgery

## 2013-08-17 ENCOUNTER — Encounter: Payer: Self-pay | Admitting: General Surgery

## 2013-08-17 VITALS — BP 134/76 | HR 76 | Resp 14 | Ht 62.0 in | Wt 240.0 lb

## 2013-08-17 DIAGNOSIS — I83893 Varicose veins of bilateral lower extremities with other complications: Secondary | ICD-10-CM

## 2013-08-17 NOTE — Progress Notes (Signed)
Patient ID: Sara Rhodes, female   DOB: 05-01-1956, 57 y.o.   MRN: [id number]  Chief Complaint  Patient presents with  . Varicose Veins    HPI Sara Rhodes is a 57 y.o. female here today for venous ultrasound. Patient did not get her stockings due to cost. HPI  Past Medical History  Diagnosis Date  . Hypertension   . Family history of anesthesia complication     N/V daughter  . Hyperlipemia     Past Surgical History  Procedure Laterality Date  . Abdominal hysterectomy      total  . Thoracic discectomy Right 03/14/2013    Procedure: THORACIC DISCECTOMY;  Surgeon: Temple Pacini, MD;  Location: [facility];  Service: Neurosurgery;  Laterality: Right;  Thoracic four-five and thoracic nine-ten laminotomy with transpedicular microdiskectomy    No family history on file.  Social History History  Substance Use Topics  . Smoking status: Former Smoker -- 0.50 packs/day for 10 years    Types: Cigarettes  . Smokeless tobacco: Never Used  . Alcohol Use: No    Allergies  Allergen Reactions  . Latex Itching and Rash  . Penicillins Itching and Rash    Current Outpatient Prescriptions  Medication Sig Dispense Refill  . amLODipine-benazepril (LOTREL) 5-20 MG per capsule Take 1 capsule by mouth at bedtime.      Marland Kitchen aspirin 81 MG tablet Take 81 mg by mouth daily.      . hydrochlorothiazide (HYDRODIURIL) 25 MG tablet Take 25 mg by mouth daily.      . Naproxen Sodium (ALEVE PO) Take 1 tablet by mouth as needed.      Marland Kitchen omeprazole (PRILOSEC) 20 MG capsule Take 20 mg by mouth daily.      . simvastatin (ZOCOR) 20 MG tablet Take 20 mg by mouth at bedtime.       No current facility-administered medications for this visit.    Review of Systems Review of Systems  Constitutional: Negative.   Respiratory: Negative.   Cardiovascular: Negative.     Blood pressure 134/76, pulse 76, resp. rate 14, height 5\' 2"  (1.575 m), weight 240 lb (108.863 kg).  Physical Exam Physical Exam   Constitutional: She is oriented to person, place, and time. She appears well-developed and well-nourished.  Neck: Neck supple.  Cardiovascular: Normal rate, regular rhythm and normal heart sounds.   VV again noted right thigh and calf and left thigh lateral.  Neurological: She is oriented to person, place, and time.  Skin: Skin is warm and dry.    Data Reviewed Limited Duplex study of veins performed. She has no abnormal reflux in left GSV  Right GSV is normal. She does have a right ALV with reflux in excess of 2 seconds. VV connect to this vein. Both LSV are small and uninvolved.  Plan   Findings discussed with pt. Problem explained in full. She needs a trial of conservative management.  Patient to return for follow up in 3 months. Encouraged to obtain compression stockings.         Ples Specter 08/17/2013, 9:18 AM

## 2013-08-17 NOTE — Patient Instructions (Signed)
Patient to return in three months.  

## 2013-08-25 ENCOUNTER — Encounter: Payer: Self-pay | Admitting: General Surgery

## 2013-09-08 ENCOUNTER — Other Ambulatory Visit: Payer: Self-pay

## 2013-11-16 ENCOUNTER — Ambulatory Visit: Payer: 59 | Admitting: General Surgery

## 2013-11-23 LAB — HM MAMMOGRAPHY

## 2013-11-29 LAB — HM PAP SMEAR: HM Pap smear: NEGATIVE

## 2013-12-07 ENCOUNTER — Encounter: Payer: Self-pay | Admitting: *Deleted

## 2013-12-07 ENCOUNTER — Encounter: Payer: Self-pay | Admitting: General Surgery

## 2013-12-07 ENCOUNTER — Ambulatory Visit (INDEPENDENT_AMBULATORY_CARE_PROVIDER_SITE_OTHER): Payer: Commercial Managed Care - PPO | Admitting: General Surgery

## 2013-12-07 VITALS — BP 130/74 | HR 80 | Resp 14 | Ht 62.0 in | Wt 250.0 lb

## 2013-12-07 DIAGNOSIS — I83893 Varicose veins of bilateral lower extremities with other complications: Secondary | ICD-10-CM

## 2013-12-07 NOTE — Progress Notes (Signed)
Patient ID: Sara Rhodes, female   DOB: 07/16/1956, 58 y.o.   MRN: [id number]  Chief Complaint  Patient presents with  . Varicose Veins    HPI Sara Rhodes is a 58 y.o. female.  Here today for follow up varicose veins.  She is wearing her compression hose. If she wears them her legs feel good.  If she stands in one place for a long time then her right groin area hurts and "burns".. States her knees hurt. Her leg symptoms are under control with the use of compression hose. She states her compression hose are 79mm.  HPI  Past Medical History  Diagnosis Date  . Hypertension   . Family history of anesthesia complication     N/V daughter  . Hyperlipemia     Past Surgical History  Procedure Laterality Date  . Abdominal hysterectomy      total  . Thoracic discectomy Right 03/14/2013    Procedure: THORACIC DISCECTOMY;  Surgeon: Charlie Pitter, MD;  Location: [place] [facility];  Service: Neurosurgery;  Laterality: Right;  Thoracic four-five and thoracic nine-ten laminotomy with transpedicular microdiskectomy    History reviewed. No pertinent family history.  Social History History  Substance Use Topics  . Smoking status: Former Smoker -- 0.50 packs/day for 10 years    Types: Cigarettes  . Smokeless tobacco: Never Used  . Alcohol Use: No    Allergies  Allergen Reactions  . Latex Itching and Rash  . Penicillins Itching and Rash    Current Outpatient Prescriptions  Medication Sig Dispense Refill  . amLODipine-benazepril (LOTREL) 5-20 MG per capsule Take 1 capsule by mouth at bedtime.      . hydrochlorothiazide (HYDRODIURIL) 25 MG tablet Take 25 mg by mouth daily.      . Naproxen Sodium (ALEVE PO) Take 1 tablet by mouth as needed.      Marland Kitchen omeprazole (PRILOSEC) 20 MG capsule Take 20 mg by mouth daily.      . simvastatin (ZOCOR) 20 MG tablet Take 20 mg by mouth at bedtime.       No current facility-administered medications for this visit.    Review of Systems Review of Systems   Constitutional: Negative.   Respiratory: Negative.   Cardiovascular: Negative.     Blood pressure 130/74, pulse 80, resp. rate 14, height 5\' 2"  (1.575 m), weight 250 lb (113.399 kg).  Physical Exam Physical Exam  Constitutional: She is oriented to person, place, and time. She appears well-developed and well-nourished.  Neck: Neck supple.  Cardiovascular:  Pulses:      Dorsalis pedis pulses are 2+ on the right side, and 2+ on the left side.       Posterior tibial pulses are 2+ on the right side, and 2+ on the left side.  No lower leg edema present.   Lymphadenopathy:    She has no cervical adenopathy.  Neurological: She is alert and oriented to person, place, and time.  Skin: Skin is warm and dry.  Varicose veins right leg unchanged.    Data Reviewed none  Assessment    Stable exam. Pt with incompetent right GSV(lateral branch)    Plan     Her leg symptoms are under control with the use of compression hose. Continue use of compression hose.       SANKAR,SEEPLAPUTHUR G 12/07/2013, 10:04 AM

## 2013-12-07 NOTE — Patient Instructions (Signed)
The patient is aware to call back for any questions or concerns.  Continue use of compression hose. 

## 2014-03-10 DIAGNOSIS — Z720 Tobacco use: Secondary | ICD-10-CM | POA: Insufficient documentation

## 2014-03-10 DIAGNOSIS — I10 Essential (primary) hypertension: Secondary | ICD-10-CM | POA: Insufficient documentation

## 2014-03-10 DIAGNOSIS — K219 Gastro-esophageal reflux disease without esophagitis: Secondary | ICD-10-CM | POA: Insufficient documentation

## 2014-03-10 DIAGNOSIS — E669 Obesity, unspecified: Secondary | ICD-10-CM | POA: Insufficient documentation

## 2014-03-10 DIAGNOSIS — E7800 Pure hypercholesterolemia, unspecified: Secondary | ICD-10-CM | POA: Insufficient documentation

## 2014-06-19 ENCOUNTER — Encounter: Payer: Self-pay | Admitting: General Surgery

## 2014-06-19 ENCOUNTER — Ambulatory Visit: Payer: Self-pay | Admitting: General Surgery

## 2014-06-19 ENCOUNTER — Ambulatory Visit (INDEPENDENT_AMBULATORY_CARE_PROVIDER_SITE_OTHER): Payer: Commercial Managed Care - PPO | Admitting: General Surgery

## 2014-06-19 VITALS — BP 132/76 | HR 78 | Resp 12 | Ht 62.0 in | Wt 237.0 lb

## 2014-06-19 DIAGNOSIS — I83893 Varicose veins of bilateral lower extremities with other complications: Secondary | ICD-10-CM

## 2014-06-19 NOTE — Progress Notes (Signed)
Patient ID: Sara Rhodes, female   DOB: 10-Dec-1955, 58 y.o.   MRN: [id number]  Chief Complaint  Patient presents with  . Follow-up    varicose veins    HPI Sara Rhodes is a 57 y.o. female here for follow up for varicose veins. She reports that she is wearing her compression stockings. She states that she has swelling in the ankles with standing all day. She also reports legs do not hurt. Otherwise she is doing well. HPI  Past Medical History  Diagnosis Date  . Hypertension   . Family history of anesthesia complication     N/V daughter  . Hyperlipemia     Past Surgical History  Procedure Laterality Date  . Abdominal hysterectomy      total  . Thoracic discectomy Right 03/14/2013    Procedure: THORACIC DISCECTOMY;  Surgeon: Charlie Pitter, MD;  Location: [place] [facility];  Service: Neurosurgery;  Laterality: Right;  Thoracic four-five and thoracic nine-ten laminotomy with transpedicular microdiskectomy    History reviewed. No pertinent family history.  Social History History  Substance Use Topics  . Smoking status: Former Smoker -- 0.50 packs/day for 10 years    Types: Cigarettes  . Smokeless tobacco: Never Used  . Alcohol Use: No    Allergies  Allergen Reactions  . Latex Itching and Rash  . Penicillins Itching and Rash    Current Outpatient Prescriptions  Medication Sig Dispense Refill  . amLODipine-benazepril (LOTREL) 5-20 MG per capsule Take 1 capsule by mouth at bedtime.      . hydrochlorothiazide (HYDRODIURIL) 25 MG tablet Take 25 mg by mouth daily.      . Naproxen Sodium (ALEVE PO) Take 1 tablet by mouth as needed.      Marland Kitchen omeprazole (PRILOSEC) 20 MG capsule Take 20 mg by mouth daily.      . simvastatin (ZOCOR) 20 MG tablet Take 20 mg by mouth at bedtime.       No current facility-administered medications for this visit.    Review of Systems Review of Systems  Blood pressure 132/76, pulse 78, resp. rate 12, height 5\' 2"  (1.575 m), weight 237 lb (107.502  kg).  Physical Exam Physical Exam  Constitutional: She is oriented to person, place, and time. She appears well-developed and well-nourished.  Cardiovascular: Normal rate, regular rhythm and normal heart sounds.   Pulses:      Dorsalis pedis pulses are 2+ on the right side, and 2+ on the left side.       Posterior tibial pulses are 2+ on the right side, and 2+ on the left side.  Varicose veins along the lateral right calf and knee going into the thigh unchanged from before. There is a new mild varicose vein in front of the left knee with some into the posterior upper thigh on the left.   Pulmonary/Chest: Effort normal and breath sounds normal.  Abdominal: Soft. She exhibits no mass. There is no tenderness.  Neurological: She is alert and oriented to person, place, and time.    Data Reviewed    Assessment    Varicose veins under good control with the use of compression hose.  Has abnormal reflux in right GSV -lateral branch.    Plan    Follow up in one year        Sara Rhodes G 06/21/2014, 6:12 AM

## 2014-06-21 ENCOUNTER — Encounter: Payer: Self-pay | Admitting: General Surgery

## 2014-06-21 NOTE — Patient Instructions (Signed)
Advised on continued use of compression hose. Cal if she has leg symptoms.

## 2014-09-04 ENCOUNTER — Encounter: Payer: Self-pay | Admitting: General Surgery

## 2015-05-02 ENCOUNTER — Encounter: Payer: Self-pay | Admitting: *Deleted

## 2015-06-20 ENCOUNTER — Ambulatory Visit: Payer: Self-pay | Admitting: General Surgery

## 2015-07-19 ENCOUNTER — Encounter: Payer: Self-pay | Admitting: *Deleted

## 2015-10-24 DIAGNOSIS — K760 Fatty (change of) liver, not elsewhere classified: Secondary | ICD-10-CM | POA: Insufficient documentation

## 2015-12-10 ENCOUNTER — Encounter: Payer: Self-pay | Admitting: Obstetrics and Gynecology

## 2015-12-11 ENCOUNTER — Encounter: Payer: Self-pay | Admitting: Obstetrics and Gynecology

## 2015-12-28 ENCOUNTER — Encounter: Payer: Self-pay | Admitting: *Deleted

## 2015-12-31 ENCOUNTER — Ambulatory Visit: Payer: Commercial Managed Care - PPO | Admitting: Anesthesiology

## 2015-12-31 ENCOUNTER — Encounter: Payer: Self-pay | Admitting: *Deleted

## 2015-12-31 ENCOUNTER — Encounter
Admission: RE | Disposition: A | Discharge status: Home / Self Care | Payer: Self-pay | Source: Ambulatory Visit | Attending: Unknown Physician Specialty

## 2015-12-31 ENCOUNTER — Ambulatory Visit
Admission: RE | Admit: 2015-12-31 | Discharge: 2015-12-31 | Disposition: A | Discharge status: Home / Self Care | Payer: Commercial Managed Care - PPO | Source: Ambulatory Visit | Attending: Unknown Physician Specialty | Admitting: Unknown Physician Specialty

## 2015-12-31 DIAGNOSIS — Z91040 Latex allergy status: Secondary | ICD-10-CM | POA: Diagnosis not present

## 2015-12-31 DIAGNOSIS — D125 Benign neoplasm of sigmoid colon: Secondary | ICD-10-CM | POA: Insufficient documentation

## 2015-12-31 DIAGNOSIS — Z1211 Encounter for screening for malignant neoplasm of colon: Secondary | ICD-10-CM | POA: Insufficient documentation

## 2015-12-31 DIAGNOSIS — I10 Essential (primary) hypertension: Secondary | ICD-10-CM | POA: Diagnosis not present

## 2015-12-31 DIAGNOSIS — I739 Peripheral vascular disease, unspecified: Secondary | ICD-10-CM | POA: Insufficient documentation

## 2015-12-31 DIAGNOSIS — K640 First degree hemorrhoids: Secondary | ICD-10-CM | POA: Diagnosis not present

## 2015-12-31 DIAGNOSIS — E785 Hyperlipidemia, unspecified: Secondary | ICD-10-CM | POA: Diagnosis not present

## 2015-12-31 DIAGNOSIS — Z79899 Other long term (current) drug therapy: Secondary | ICD-10-CM | POA: Diagnosis not present

## 2015-12-31 DIAGNOSIS — K621 Rectal polyp: Secondary | ICD-10-CM | POA: Diagnosis not present

## 2015-12-31 DIAGNOSIS — K222 Esophageal obstruction: Secondary | ICD-10-CM | POA: Insufficient documentation

## 2015-12-31 DIAGNOSIS — Z886 Allergy status to analgesic agent status: Secondary | ICD-10-CM | POA: Insufficient documentation

## 2015-12-31 DIAGNOSIS — F17210 Nicotine dependence, cigarettes, uncomplicated: Secondary | ICD-10-CM | POA: Diagnosis not present

## 2015-12-31 DIAGNOSIS — K210 Gastro-esophageal reflux disease with esophagitis: Secondary | ICD-10-CM | POA: Insufficient documentation

## 2015-12-31 DIAGNOSIS — R1310 Dysphagia, unspecified: Secondary | ICD-10-CM | POA: Diagnosis not present

## 2015-12-31 DIAGNOSIS — Z880 Allergy status to penicillin: Secondary | ICD-10-CM | POA: Insufficient documentation

## 2015-12-31 HISTORY — DX: Obesity, unspecified: E66.9

## 2015-12-31 HISTORY — PX: ESOPHAGOGASTRODUODENOSCOPY (EGD) WITH PROPOFOL: SHX5813

## 2015-12-31 HISTORY — PX: COLONOSCOPY WITH PROPOFOL: SHX5780

## 2015-12-31 HISTORY — DX: Fatty (change of) liver, not elsewhere classified: K76.0

## 2015-12-31 LAB — HM COLONOSCOPY

## 2015-12-31 SURGERY — COLONOSCOPY WITH PROPOFOL
Anesthesia: General

## 2015-12-31 MED ORDER — SODIUM CHLORIDE 0.9 % IV SOLN: INTRAVENOUS | Status: DC

## 2015-12-31 MED ORDER — SODIUM CHLORIDE 0.9 % IV SOLN
INTRAVENOUS | Status: DC
  Administered 2015-12-31: 1000 mL via INTRAVENOUS

## 2015-12-31 MED ORDER — PROPOFOL 10 MG/ML IV BOLUS
INTRAVENOUS | Status: DC | PRN
  Administered 2015-12-31: 50 mg via INTRAVENOUS

## 2015-12-31 MED ORDER — PROPOFOL 500 MG/50ML IV EMUL
INTRAVENOUS | Status: DC | PRN
  Administered 2015-12-31: 100 ug/kg/min via INTRAVENOUS

## 2015-12-31 MED ORDER — MIDAZOLAM HCL 5 MG/5ML IJ SOLN
INTRAMUSCULAR | Status: DC | PRN
  Administered 2015-12-31: 1 mg via INTRAVENOUS

## 2015-12-31 MED ORDER — LIDOCAINE HCL (PF) 2 % IJ SOLN
INTRAMUSCULAR | Status: DC | PRN
  Administered 2015-12-31: 60 mg

## 2015-12-31 MED ORDER — FENTANYL CITRATE (PF) 100 MCG/2ML IJ SOLN
INTRAMUSCULAR | Status: DC | PRN
Start: 2015-12-31 — End: 2015-12-31
  Administered 2015-12-31: 50 ug via INTRAVENOUS

## 2015-12-31 NOTE — Op Note (Signed)
[facility] Gastroenterology Patient Name: Sara Rhodes Procedure Date: 12/31/2015 12:48 PM MRN: [id number] Account #: [id number] Date of Birth: 04/24/1956 Admit Type: Outpatient Age: 60 Room: [facility] ENDO ROOM 1 Gender: Female Note Status: Finalized Procedure:            Upper GI endoscopy Indications:          Dysphagia Providers:            Manya Silvas, MD Referring MD:         Shirline Frees (Referring MD) Medicines:            Propofol per Anesthesia Complications:        No immediate complications. Procedure:            Pre-Anesthesia Assessment:                       - After reviewing the risks and benefits, the patient                        was deemed in satisfactory condition to undergo the                        procedure.                       After obtaining informed consent, the endoscope was                        passed under direct vision. Throughout the procedure,                        the patient's blood pressure, pulse, and oxygen                        saturations were monitored continuously. The Endoscope                        was introduced through the mouth, and advanced to the                        second part of duodenum. The upper GI endoscopy was                        accomplished without difficulty. The patient tolerated                        the procedure well. Findings:      LA Grade A (one or more mucosal breaks less than 5 mm, not extending       between tops of 2 mucosal folds) esophagitis with no bleeding was found       40 cm from the incisors. Biopsies were taken with a cold forceps for       histology.      A mild Schatzki ring (acquired) was found at the gastroesophageal       junction. A guidewire was placed and the scope was withdrawn. Dilation       was performed with a Savary dilator with mild resistance at 16 mm.      The stomach was normal.      Scattered mild mucosal changes characterized by petechiae  were found in  the duodenal bulb.      The examined 2ed portion of the duodenum was normal. Impression:           - LA Grade A reflux esophagitis. Rule out Barrett's                        esophagus. Biopsied.                       - Mild Schatzki ring. Dilated.                       - Normal stomach.                       - Mucosal changes in the duodenum.                       - Normal examined duodenum. Recommendation:       - Await pathology results.                       - soft food for 3 days, eat slowly, chew well, take                        small bites Manya Silvas, MD 12/31/2015 1:12:25 PM This report has been signed electronically. Number of Addenda: 0 Note Initiated On: 12/31/2015 12:48 PM      [facility]

## 2015-12-31 NOTE — Anesthesia Preprocedure Evaluation (Addendum)
Anesthesia Evaluation  Patient identified by MRN, date of birth, ID band Patient awake    Reviewed: Allergy & Precautions, H&P , NPO status , Patient's Chart, lab work & pertinent test results, reviewed documented beta blocker date and time   History of Anesthesia Complications (+) Family history of anesthesia reaction  Airway Mallampati: II  TM Distance: >3 FB   Mouth opening: Limited Mouth Opening  Dental  (+) Chipped   Pulmonary neg pulmonary ROS, Current Smoker,    Pulmonary exam normal breath sounds clear to auscultation       Cardiovascular hypertension, Pt. on medications + Peripheral Vascular Disease  Normal cardiovascular exam Rhythm:Regular Rate:Normal     Neuro/Psych negative neurological ROS  negative psych ROS   GI/Hepatic negative GI ROS, Neg liver ROS, GERD  Medicated and Controlled,  Endo/Other  negative endocrine ROS  Renal/GU negative Renal ROS  negative genitourinary   Musculoskeletal negative musculoskeletal ROS (+)   Abdominal Normal abdominal exam  (+)   Peds negative pediatric ROS (+)  Hematology negative hematology ROS (+)   Anesthesia Other Findings   Reproductive/Obstetrics                            Anesthesia Physical  Anesthesia Plan  ASA: II  Anesthesia Plan: General   Post-op Pain Management:    Induction: Intravenous  Airway Management Planned: Nasal Cannula  Additional Equipment:   Intra-op Plan:   Post-operative Plan: Extubation in OR  Informed Consent: I have reviewed the patients History and Physical, chart, labs and discussed the procedure including the risks, benefits and alternatives for the proposed anesthesia with the patient or authorized representative who has indicated his/her understanding and acceptance.   Dental advisory given  Plan Discussed with: CRNA and Surgeon  Anesthesia Plan Comments:         Anesthesia Quick  Evaluation

## 2015-12-31 NOTE — Op Note (Signed)
[facility] Gastroenterology Patient Name: Sara Rhodes Procedure Date: 12/31/2015 12:47 PM MRN: [id number] Account #: [id number] Date of Birth: 10/02/1956 Admit Type: Outpatient Age: 60 Room: [facility] ENDO ROOM 1 Gender: Female Note Status: Finalized Procedure:            Colonoscopy Indications:          High risk colon cancer surveillance: Personal history                        of colonic polyps Providers:            Manya Silvas, MD Referring MD:         Shirline Frees (Referring MD) Medicines:            Propofol per Anesthesia Complications:        No immediate complications. Procedure:            Pre-Anesthesia Assessment:                       - After reviewing the risks and benefits, the patient                        was deemed in satisfactory condition to undergo the                        procedure.                       After obtaining informed consent, the colonoscope was                        passed under direct vision. Throughout the procedure,                        the patient's blood pressure, pulse, and oxygen                        saturations were monitored continuously. The                        Colonoscope was introduced through the anus and                        advanced to the the cecum, identified by appendiceal                        orifice and ileocecal valve. The colonoscopy was                        performed without difficulty. The patient tolerated the                        procedure well. The quality of the bowel preparation                        was excellent. Findings:      Two sessile polyps were found in the rectum and sigmoid colon. The       polyps were diminutive in size. These polyps were removed with a jumbo       cold forceps. Resection and retrieval were complete.      Internal  hemorrhoids were found during endoscopy. The hemorrhoids were       small and Grade I (internal hemorrhoids that do not  prolapse).      The exam was otherwise without abnormality. Impression:           - Two diminutive polyps in the rectum and in the                        sigmoid colon, removed with a jumbo cold forceps.                        Resected and retrieved.                       - Internal hemorrhoids. Recommendation:       - Await pathology results. Manya Silvas, MD 12/31/2015 1:37:27 PM This report has been signed electronically. Number of Addenda: 0 Note Initiated On: 12/31/2015 12:47 PM Scope Withdrawal Time: 0 hours 9 minutes 17 seconds  Total Procedure Duration: 0 hours 18 minutes 3 seconds       [facility]

## 2015-12-31 NOTE — Anesthesia Postprocedure Evaluation (Signed)
Anesthesia Post Note  Patient: Sara Rhodes  Procedure(s) Performed: Procedure(s) (LRB): COLONOSCOPY WITH PROPOFOL (N/A) ESOPHAGOGASTRODUODENOSCOPY (EGD) WITH PROPOFOL (N/A)  Patient location during evaluation: PACU Anesthesia Type: General Level of consciousness: awake and alert and oriented Pain management: pain level controlled Vital Signs Assessment: post-procedure vital signs reviewed and stable Respiratory status: spontaneous breathing Cardiovascular status: blood pressure returned to baseline Anesthetic complications: no    Last Vitals:  Filed Vitals:   12/31/15 1359 12/31/15 1409  BP: 138/60 135/72  Pulse: 63 59  Temp:    Resp: 14 14    Last Pain: There were no vitals filed for this visit.               Joss Mcdill

## 2015-12-31 NOTE — Transfer of Care (Signed)
Immediate Anesthesia Transfer of Care Note  Patient: Sara Rhodes  Procedure(s) Performed: Procedure(s): COLONOSCOPY WITH PROPOFOL (N/A) ESOPHAGOGASTRODUODENOSCOPY (EGD) WITH PROPOFOL (N/A)  Patient Location: PACU  Anesthesia Type:General  Level of Consciousness: sedated  Airway & Oxygen Therapy: Patient Spontanous Breathing and Patient connected to nasal cannula oxygen  Post-op Assessment: Report given to RN and Post -op Vital signs reviewed and stable  Post vital signs: Reviewed and stable  Last Vitals:  Filed Vitals:   12/31/15 1107 12/31/15 1339  BP: 146/75 119/64  Pulse: 61 77  Temp: 37.1 C 36 C  Resp: 18 19    Complications: No apparent anesthesia complications

## 2015-12-31 NOTE — H&P (Signed)
Primary Care Physician:  Sherrin Daisy, MD Primary Gastroenterologist:  Dr. Vira Agar   Pre-Procedure History & Physical: HPI:  Sara Rhodes is a 60 y.o. female is here for an endoscopy and colonoscopy.   Past Medical History  Diagnosis Date  . Hypertension   . Family history of anesthesia complication     N/V daughter  . Hyperlipemia   . Tobacco use   . Acid reflux   . Vulvar itching   . Vaginal atrophy   . Obesity   . Fatty liver     Past Surgical History  Procedure Laterality Date  . Thoracic discectomy Right 03/14/2013    Procedure: THORACIC DISCECTOMY;  Surgeon: Charlie Pitter, MD;  Location: [place] [facility];  Service: Neurosurgery;  Laterality: Right;  Thoracic four-five and thoracic nine-ten laminotomy with transpedicular microdiskectomy  . Abdominal hysterectomy      total- lsh bso- fibroids  . Cesarean section    . Back surgery      Prior to Admission medications   Medication Sig Start Date End Date Taking? Authorizing Provider  amLODipine-benazepril (LOTREL) 5-20 MG per capsule Take 1 capsule by mouth at bedtime.   Yes Historical Provider, MD  polyethylene glycol powder (GLYCOLAX/MIRALAX) powder Take 1 Container by mouth once.   Yes Historical Provider, MD  simvastatin (ZOCOR) 20 MG tablet Take 20 mg by mouth at bedtime.   Yes Historical Provider, MD  hydrochlorothiazide (HYDRODIURIL) 25 MG tablet Take 25 mg by mouth daily.    Historical Provider, MD  Naproxen Sodium (ALEVE PO) Take 1 tablet by mouth as needed.    Historical Provider, MD  omeprazole (PRILOSEC) 20 MG capsule Take 20 mg by mouth daily.    Historical Provider, MD    Allergies as of 12/25/2015 - Review Complete 06/21/2014  Allergen Reaction Noted  . Ibuprofen Other (See Comments) 12/05/2015  . Latex Itching and Rash 02/28/2013  . Penicillins Itching and Rash 02/28/2013    Family History  Problem Relation Age of Onset  . Cancer Neg Hx   . Diabetes Neg Hx   . Heart disease Neg Hx      Social History   Social History  . Marital Status: Married    Spouse Name: N/A  . Number of Children: N/A  . Years of Education: N/A   Occupational History  . Not on file.   Social History Main Topics  . Smoking status: Current Every Day Smoker -- 0.50 packs/day for 10 years    Types: Cigarettes  . Smokeless tobacco: Never Used  . Alcohol Use: No  . Drug Use: No  . Sexual Activity: Yes    Birth Control/ Protection: Surgical   Other Topics Concern  . Not on file   Social History Narrative    Review of Systems: See HPI, otherwise negative ROS  Physical Exam: BP 135/72 mmHg  Pulse 59  Temp(Src) 96.8 F (36 C) (Tympanic)  Resp 14  Ht 5\' 2"  (1.575 m)  Wt 102.059 kg (225 lb)  BMI 41.14 kg/m2  SpO2 99% General:   Alert,  pleasant and cooperative in NAD Head:  Normocephalic and atraumatic. Neck:  Supple; no masses or thyromegaly. Lungs:  Clear throughout to auscultation.    Heart:  Regular rate and rhythm. Abdomen:  Soft, nontender and nondistended. Normal bowel sounds, without guarding, and without rebound.   Neurologic:  Alert and  oriented x4;  grossly normal neurologically.  Impression/Plan: Sara Rhodes is here for an endoscopy and colonoscopy to be  performed for [facility] colon polyps and dysphagia  Risks, benefits, limitations, and alternatives regarding  endoscopy and colonoscopy have been reviewed with the patient.  Questions have been answered.  All parties agreeable.   Gaylyn Cheers, MD  12/31/2015, 3:35 PM

## 2016-01-01 ENCOUNTER — Encounter: Payer: Self-pay | Admitting: Obstetrics and Gynecology

## 2016-01-02 ENCOUNTER — Encounter: Payer: Self-pay | Admitting: Unknown Physician Specialty

## 2016-01-02 LAB — SURGICAL PATHOLOGY

## 2016-01-15 ENCOUNTER — Ambulatory Visit (INDEPENDENT_AMBULATORY_CARE_PROVIDER_SITE_OTHER): Payer: Commercial Managed Care - PPO | Admitting: Obstetrics and Gynecology

## 2016-01-15 ENCOUNTER — Encounter: Payer: Self-pay | Admitting: Obstetrics and Gynecology

## 2016-01-15 VITALS — BP 127/79 | HR 58 | Ht 62.0 in | Wt 226.1 lb

## 2016-01-15 DIAGNOSIS — Z780 Asymptomatic menopausal state: Secondary | ICD-10-CM

## 2016-01-15 DIAGNOSIS — R638 Other symptoms and signs concerning food and fluid intake: Secondary | ICD-10-CM | POA: Diagnosis not present

## 2016-01-15 DIAGNOSIS — Z0000 Encounter for general adult medical examination without abnormal findings: Secondary | ICD-10-CM | POA: Diagnosis not present

## 2016-01-15 DIAGNOSIS — Z01419 Encounter for gynecological examination (general) (routine) without abnormal findings: Secondary | ICD-10-CM

## 2016-01-15 NOTE — Patient Instructions (Signed)
1.  Pap smear not needed 2. Mammogram is ordered 3. Stool guaiac is deferred colonoscopy this year 4. Calcium with vitamin D supplementation is recommended 5. Return in 1 year 6. Smoking cessation encouraged 7. Weight loss encouraged

## 2016-01-15 NOTE — Progress Notes (Signed)
ANNUAL PREVENTATIVE CARE GYN  ENCOUNTER NOTE  Subjective:       Sara Rhodes is a 60 y.o. [id number] female here for a routine annual gynecologic exam.  Current complaints: 1.  None    Gynecologic History No LMP recorded. Patient has had a hysterectomy. Contraception: hysterectomy Last Pap: 2015. Results were: normal Last mammogram: 2015. Results were: left breast calcifications at 11:00  Patient in menopausal state, mildly symptomatic.   Obstetric History OB History  Gravida Para Term Preterm AB SAB TAB Ectopic Multiple Living  3 3 3  0 0 0 0 0 0 3    # Outcome Date GA Lbr Len/2nd Weight Sex Delivery Anes PTL Lv  3 Term    8 lb (3.629 kg) F CS-LTranv   Y  2 Term    8 lb (3.629 kg) F CS-LTranv   Y  1 Term    8 lb 9.6 oz (3.901 kg) M Vag-Spont   Y    Obstetric Comments  1st Menstrual Cycle: 12  1st Pregnancy: 25    Past Medical History  Diagnosis Date  . Hypertension   . Family history of anesthesia complication     N/V daughter  . Hyperlipemia   . Tobacco use   . Acid reflux   . Vulvar itching   . Vaginal atrophy   . Obesity   . Fatty liver   . Behcet's disease (Sara Rhodes)   . Schatzki's ring     Past Surgical History  Procedure Laterality Date  . Thoracic discectomy Right 03/14/2013    Procedure: THORACIC DISCECTOMY;  Surgeon: Charlie Pitter, MD;  Location: [place] [facility] OR[facility];  Service: Neurosurgery;  Laterality: Right;  Thoracic four-five and thoracic nine-ten laminotomy with transpedicular microdiskectomy  . Cesarean section    . Back surgery    . Colonoscopy with propofol N/A 12/31/2015    Procedure: COLONOSCOPY WITH PROPOFOL;  Surgeon: Manya Silvas, MD;  Location: [facility];  Service: Endoscopy;  Laterality: N/A;  . Esophagogastroduodenoscopy (egd) with propofol N/A 12/31/2015    Procedure: ESOPHAGOGASTRODUODENOSCOPY (EGD) WITH PROPOFOL;  Surgeon: Manya Silvas, MD;  Location: [facility] END[facility];  Service: Endoscopy;  Laterality: N/A;  . Abdominal hysterectomy       total- lsh bso- fibroids    Current Outpatient Prescriptions on File Prior to Visit  Medication Sig Dispense Refill  . amLODipine-benazepril (LOTREL) 5-20 MG per capsule Take 1 capsule by mouth at bedtime.    . hydrochlorothiazide (HYDRODIURIL) 25 MG tablet Take 25 mg by mouth daily.    . Naproxen Sodium (ALEVE PO) Take 1 tablet by mouth as needed.    Marland Kitchen omeprazole (PRILOSEC) 20 MG capsule Take 20 mg by mouth daily.    . simvastatin (ZOCOR) 20 MG tablet Take 20 mg by mouth at bedtime.     No current facility-administered medications on file prior to visit.    Allergies  Allergen Reactions  . Ibuprofen Other (See Comments)  . Latex Itching and Rash  . Penicillins Itching and Rash    Social History   Social History  . Marital Status: Married    Spouse Name: N/A  . Number of Children: N/A  . Years of Education: N/A   Occupational History  . Not on file.   Social History Main Topics  . Smoking status: Current Every Day Smoker -- 0.50 packs/day for 10 years    Types: Cigarettes  . Smokeless tobacco: Never Used  . Alcohol Use: No  . Drug Use: No  .  Sexual Activity: Yes    Birth Control/ Protection: Surgical   Other Topics Concern  . Not on file   Social History Narrative    Family History  Problem Relation Age of Onset  . Cancer Neg Hx   . Diabetes Neg Hx   . Heart disease Neg Hx   . Crohn's disease Daughter     The following portions of the patient's history were reviewed and updated as appropriate: allergies, current medications, past family history, past medical history, past social history, past surgical history and problem list.  Review of Systems ROS Review of Systems - General ROS: negative for - chills, fatigue, fever, night sweats, weight gain or weight loss.  Positive for - hot flashes, memory loss, insomnia Psychological ROS: negative for - anxiety, decreased libido, depression, mood swings, physical abuse or sexual abuse Ophthalmic ROS: negative  for - blurry vision, eye pain or loss of vision ENT ROS: negative for - headaches, hearing change, visual changes or vocal changes Allergy and Immunology ROS: negative for - hives, itchy/watery eyes or seasonal allergies Hematological and Lymphatic ROS: negative for - bleeding problems, bruising, swollen lymph nodes or weight loss Endocrine ROS: negative for - galactorrhea, hair pattern changes, hot flashes, malaise/lethargy, mood swings, palpitations, polydipsia/polyuria, skin changes, temperature intolerance or unexpected weight changes Breast ROS: negative for - new or changing breast lumps or nipple discharge Respiratory ROS: negative for - cough or shortness of breath Cardiovascular ROS: negative for - chest pain, irregular heartbeat, palpitations or shortness of breath Gastrointestinal ROS: no abdominal pain, change in bowel habits, or black or bloody stools Genito-Urinary ROS: no dysuria, trouble voiding, or hematuria Musculoskeletal ROS: negative for - joint pain or joint stiffness Neurological ROS: negative for - bowel and bladder control changes Dermatological ROS: negative for rash and skin lesion changes   Objective:   BP 127/79 mmHg  Pulse 58  Ht 5\' 2"  (1.575 m)  Wt 226 lb 1 oz (102.541 kg)  BMI 41.34 kg/m2 CONSTITUTIONAL: Well-developed, well-nourished female in no acute distress.  PSYCHIATRIC: Normal mood and affect. Normal behavior. Normal judgment and thought content. [place]: Alert and oriented to person, place, and time. Normal muscle tone coordination. No cranial nerve deficit noted. HENT:  Normocephalic, atraumatic, External right and left ear normal. Oropharynx is clear and moist EYES: Conjunctivae and EOM are normal. Pupils are equal, round, and reactive to light. No scleral icterus.  NECK: Normal range of motion, supple, no masses.  Normal thyroid.  SKIN: Skin is warm and dry. No rash noted. Not diaphoretic. No erythema. No pallor. CARDIOVASCULAR: Normal heart  rate noted, regular rhythm, no murmur. RESPIRATORY: Clear to auscultation bilaterally. Effort and breath sounds normal, no problems with respiration noted. BREASTS: Symmetric in size. No masses, skin changes, nipple drainage, or lymphadenopathy. ABDOMEN: Soft, normal bowel sounds, no distention noted.  No tenderness, rebound or guarding. Midline incision, well-healed, no hernia BLADDER: Normal PELVIC:  External Genitalia: Normal  BUS: Normal  Vagina: Mild atrophy  Cervix: Normal, no cervical motion tenderness  Uterus: Normal, non-enlarged  Adnexa: Normal, non-palpable, no tenderness  RV: External Exam NormaI, internal rectal exam deferred due to recent colonoscopy MUSCULOSKELETAL: Normal range of motion. No tenderness.  No cyanosis, clubbing, or edema.  2+ distal pulses. LYMPHATIC: No Axillary, Supraclavicular, or Inguinal Adenopathy.    Assessment:   Annual gynecologic examination 60 y.o. Contraception: status post hysterectomy Overweight and Obesity 1 Menopause, minimally symptomatic  Plan:  Pap: Not needed and Not done Mammogram: Ordered Stool Guaiac Testing:  Not Ordered and Not Indicated Routine preventative health maintenance measures emphasized: Exercise/Diet/Weight control and Tobacco Warnings.  Patient recommended to continue daily exercise and dietary restriction; weight loss encouraged due to increased BMI. Importance of smoking cessation emphasized. Continue vitamin D supplementation, add Tums for increased absorption.  Return to [place], Student-PA  Brayton Mars, MD   I have seen, interviewed, and examined the patient in conjunction with the [facility].A. student and affirm the diagnosis and management plan. Martin A. DeFrancesco, MD, FACOG   Note: This dictation was prepared with Dragon dictation along with smaller phrase technology. Any transcriptional errors that result from this process are unintentional.

## 2016-02-22 ENCOUNTER — Emergency Department: Payer: Commercial Managed Care - PPO

## 2016-02-22 ENCOUNTER — Emergency Department
Admission: EM | Admit: 2016-02-22 | Discharge: 2016-02-22 | Disposition: A | Discharge status: Home / Self Care | Payer: Commercial Managed Care - PPO | Attending: Emergency Medicine | Admitting: Emergency Medicine

## 2016-02-22 ENCOUNTER — Encounter: Payer: Self-pay | Admitting: Emergency Medicine

## 2016-02-22 DIAGNOSIS — N390 Urinary tract infection, site not specified: Secondary | ICD-10-CM

## 2016-02-22 DIAGNOSIS — E785 Hyperlipidemia, unspecified: Secondary | ICD-10-CM | POA: Diagnosis not present

## 2016-02-22 DIAGNOSIS — I10 Essential (primary) hypertension: Secondary | ICD-10-CM | POA: Diagnosis not present

## 2016-02-22 DIAGNOSIS — Z79899 Other long term (current) drug therapy: Secondary | ICD-10-CM | POA: Diagnosis not present

## 2016-02-22 DIAGNOSIS — F17210 Nicotine dependence, cigarettes, uncomplicated: Secondary | ICD-10-CM | POA: Diagnosis not present

## 2016-02-22 DIAGNOSIS — R1031 Right lower quadrant pain: Secondary | ICD-10-CM

## 2016-02-22 DIAGNOSIS — E669 Obesity, unspecified: Secondary | ICD-10-CM | POA: Insufficient documentation

## 2016-02-22 DIAGNOSIS — Z91040 Latex allergy status: Secondary | ICD-10-CM | POA: Diagnosis not present

## 2016-02-22 LAB — COMPREHENSIVE METABOLIC PANEL
ALBUMIN: 4.4 g/dL (ref 3.5–5.0)
ALK PHOS: 74 U/L (ref 38–126)
ALT: 34 U/L (ref 14–54)
AST: 34 U/L (ref 15–41)
Anion gap: 9 (ref 5–15)
BUN: 8 mg/dL (ref 6–20)
CALCIUM: 9.4 mg/dL (ref 8.9–10.3)
CHLORIDE: 101 mmol/L (ref 101–111)
CO2: 28 mmol/L (ref 22–32)
CREATININE: 0.68 mg/dL (ref 0.44–1.00)
GFR calc Af Amer: >60 mL/min (ref >60)
GFR calc non Af Amer: >60 mL/min (ref >60)
GLUCOSE: 108 mg/dL — AB (ref 65–99)
Potassium: 3.5 mmol/L (ref 3.5–5.1)
SODIUM: 138 mmol/L (ref 135–145)
Total Bilirubin: 0.8 mg/dL (ref 0.3–1.2)
Total Protein: 7.7 g/dL (ref 6.5–8.1)

## 2016-02-22 LAB — URINALYSIS COMPLETE WITH MICROSCOPIC (ARMC ONLY)
BILIRUBIN URINE: NEGATIVE
Bacteria, UA: NONE SEEN
Glucose, UA: NEGATIVE mg/dL
KETONES UR: NEGATIVE mg/dL
Nitrite: NEGATIVE
PH: 6 (ref 5.0–8.0)
PROTEIN: NEGATIVE mg/dL
RBC / HPF: NONE SEEN RBC/hpf (ref 0–5)
Specific Gravity, Urine: 1.003 — ABNORMAL LOW (ref 1.005–1.030)

## 2016-02-22 LAB — CBC
HCT: 42.3 % (ref 35.0–47.0)
Hemoglobin: 14.5 g/dL (ref 12.0–16.0)
MCH: 30.9 pg (ref 26.0–34.0)
MCHC: 34.3 g/dL (ref 32.0–36.0)
MCV: 90 fL (ref 80.0–100.0)
PLATELETS: 224 10*3/uL (ref 150–440)
RBC: 4.7 MIL/uL (ref 3.80–5.20)
RDW: 13.2 % (ref 11.5–14.5)
WBC: 7.9 10*3/uL (ref 3.6–11.0)

## 2016-02-22 LAB — LIPASE, BLOOD: Lipase: 22 U/L (ref 11–51)

## 2016-02-22 MED ORDER — TRAMADOL HCL 50 MG PO TABS: 50.0000 mg | ORAL_TABLET | Freq: Four times a day (QID) | ORAL | Status: DC | PRN

## 2016-02-22 MED ORDER — IOPAMIDOL (ISOVUE-300) INJECTION 61%
100.0000 mL | Freq: Once | INTRAVENOUS | Status: AC | PRN
  Administered 2016-02-22: 100 mL via INTRAVENOUS
  Filled 2016-02-22: qty 100

## 2016-02-22 MED ORDER — ONDANSETRON HCL 4 MG/2ML IJ SOLN
4.0000 mg | Freq: Once | INTRAMUSCULAR | Status: AC
  Administered 2016-02-22: 4 mg via INTRAVENOUS
  Filled 2016-02-22: qty 2

## 2016-02-22 MED ORDER — DIATRIZOATE MEGLUMINE & SODIUM 66-10 % PO SOLN: 15.0000 mL | Freq: Once | ORAL | Status: DC

## 2016-02-22 MED ORDER — CIPROFLOXACIN HCL 500 MG PO TABS: 500.0000 mg | ORAL_TABLET | Freq: Two times a day (BID) | ORAL | Status: DC

## 2016-02-22 MED ORDER — MORPHINE SULFATE (PF) 4 MG/ML IV SOLN
4.0000 mg | Freq: Once | INTRAVENOUS | Status: AC
  Administered 2016-02-22: 4 mg via INTRAVENOUS
  Filled 2016-02-22: qty 1

## 2016-02-22 NOTE — ED Notes (Signed)
Reviewed d/c instructions, follow-up care, prescriptions, and need to increase fluids with pt. Pt verbalized understanding .

## 2016-02-22 NOTE — ED Notes (Signed)
Pt finished drinking PO contrast for CT at this time.

## 2016-02-22 NOTE — ED Provider Notes (Signed)
[facility] Emergency Department Provider Note  ____________________________________________  Time seen: Approximately 4:01 PM  I have reviewed the triage vital signs and the nursing notes.   HISTORY  Chief Complaint Abdominal Pain    HPI Sara Rhodes is a 60 y.o. female who presents to emergency department complaining of right lower quadrant abdominal pain. Patient states that pain has been ongoing 3 days but has been increasingly worsening. Patient was seen by her primary care provider today and was sent to the emergency department for evaluation for appendicitis. Patient states that the pain is sharp, constant, worse with movement. Patient denies any fevers or chills, nausea or vomiting, diarrhea or constipation. Patient has had some anorexia due to this condition. Patient denies any hematuria, dysuria, polyuria. Patient has had a complete hysterectomy with oophorectomy.   Past Medical History  Diagnosis Date  . Hypertension   . Family history of anesthesia complication     N/V daughter  . Hyperlipemia   . Tobacco use   . Acid reflux   . Vulvar itching   . Vaginal atrophy   . Obesity   . Fatty liver   . Behcet's disease ([place])   . Schatzki's ring     Patient Active Problem List   Diagnosis Date Noted  . Varicose veins of lower extremities with other complications 0000000  . HNP (herniated nucleus pulposus), thoracic 03/14/2013    Past Surgical History  Procedure Laterality Date  . Thoracic discectomy Right 03/14/2013    Procedure: THORACIC DISCECTOMY;  Surgeon: Charlie Pitter, MD;  Location: [place] [facility]UR[facility] OR[facility];  Service: Neurosurgery;  Laterality: Right;  Thoracic four-five and thoracic nine-ten laminotomy with transpedicular microdiskectomy  . Cesarean section    . Back surgery    . Colonoscopy with propofol N/A 12/31/2015    Procedure: COLONOSCOPY WITH PROPOFOL;  Surgeon: Manya Silvas, MD;  Location: [facility] ENDOSCOPY;  Service: Endoscopy;   Laterality: N/A;  . Esophagogastroduodenoscopy (egd) with propofol N/A 12/31/2015    Procedure: ESOPHAGOGASTRODUODENOSCOPY (EGD) WITH PROPOFOL;  Surgeon: Manya Silvas, MD;  Location: [facility] ENDOSCOPY;  Service: Endoscopy;  Laterality: N/A;  . Abdominal hysterectomy      total- lsh bso- fibroids    Current Outpatient Rx  Name  Route  Sig  Dispense  Refill  . amLODipine-benazepril (LOTREL) 5-20 MG per capsule   Oral   Take 1 capsule by mouth at bedtime.         . ciprofloxacin (CIPRO) 500 MG tablet   Oral   Take 1 tablet (500 mg total) by mouth 2 (two) times daily.   6 tablet   0   . hydrochlorothiazide (HYDRODIURIL) 25 MG tablet   Oral   Take 25 mg by mouth daily.         . Naproxen Sodium (ALEVE PO)   Oral   Take 1 tablet by mouth as needed.         Marland Kitchen omeprazole (PRILOSEC) 20 MG capsule   Oral   Take 20 mg by mouth daily.         . simvastatin (ZOCOR) 20 MG tablet   Oral   Take 20 mg by mouth at bedtime.         . traMADol (ULTRAM) 50 MG tablet   Oral   Take 1 tablet (50 mg total) by mouth every 6 (six) hours as needed.   10 tablet   0     Allergies Ibuprofen; Latex; and Penicillins  Family History  Problem  Relation Age of Onset  . Cancer Neg Hx   . Diabetes Neg Hx   . Heart disease Neg Hx   . Crohn's disease Daughter     Social History Social History  Substance Use Topics  . Smoking status: Current Every Day Smoker -- 0.50 packs/day for 10 years    Types: Cigarettes  . Smokeless tobacco: Never Used  . Alcohol Use: No     Review of Systems  Constitutional: No fever/chills Cardiovascular: no chest pain. Respiratory: no cough. No SOB. Gastrointestinal: Positive for right lower quadrant abdominal pain.  No nausea, no vomiting.  No diarrhea.  No constipation. Genitourinary: Negative for dysuria. Negative for polyuria. No hematuria Musculoskeletal: Negative for back pain. Skin: Negative for rash. Neurological: Negative for headaches, focal  weakness or numbness. 10-point ROS otherwise negative.  ____________________________________________   PHYSICAL EXAM:  VITAL SIGNS: ED Triage Vitals  Enc Vitals Group     BP 02/22/16 1321 178/69 mmHg     Pulse Rate 02/22/16 1321 72     Resp 02/22/16 1321 20     Temp 02/22/16 1321 97.9 F (36.6 C)     Temp Source 02/22/16 1321 Oral     SpO2 02/22/16 1321 97 %     Weight 02/22/16 1321 226 lb (102.513 kg)     Height --      Head Cir --      Peak Flow --      Pain Score 02/22/16 1321 4     Pain Loc --      Pain Edu? --      Excl. in [place]? --      Constitutional: Alert and oriented. Well appearing and in no acute distress. Eyes: Conjunctivae are normal. PERRL. EOMI. Head: Atraumatic. Cardiovascular: Normal rate, regular rhythm. Normal S1 and S2.  Good peripheral circulation. Respiratory: Normal respiratory effort without tachypnea or retractions. Lungs CTAB. Gastrointestinal: Bowel sounds 4 quadrants. Soft to palpation. Patient is tender to palpation in the right lower quadrant. No guarding or rigidity is noted. No palpable masses.. No distention. No CVA tenderness. Musculoskeletal: No lower extremity tenderness nor edema.  No joint effusions. Neurologic:  Normal speech and language. No gross focal neurologic deficits are appreciated.  Skin:  Skin is warm, dry and intact. No rash noted. Psychiatric: Mood and affect are normal. Speech and behavior are normal. Patient exhibits appropriate insight and judgement.   ____________________________________________   LABS (all labs ordered are listed, but only abnormal results are displayed)  Labs Reviewed  COMPREHENSIVE METABOLIC PANEL - Abnormal; Notable for the following:    Glucose, Bld 108 (*)    All other components within normal limits  URINALYSIS COMPLETEWITH MICROSCOPIC ([facility] ONLY) - Abnormal; Notable for the following:    Color, Urine STRAW (*)    APPearance CLEAR (*)    Specific Gravity, Urine 1.003 (*)    Hgb urine  dipstick 1+ (*)    Leukocytes, UA TRACE (*)    Squamous Epithelial / LPF 0-5 (*)    All other components within normal limits  LIPASE, BLOOD  CBC   ____________________________________________  EKG   ____________________________________________  RADIOLOGY Diamantina Providence Cuthriell, personally viewed and evaluated these images as part of my medical decision making, as well as reviewing the written report by the radiologist.  Ct Abdomen Pelvis W Contrast  02/22/2016  CLINICAL DATA:  Right lower quadrant abdominal pain with nausea for 3 days. EXAM: CT ABDOMEN AND PELVIS WITH CONTRAST TECHNIQUE: Multidetector CT imaging of the  abdomen and pelvis was performed using the standard protocol following bolus administration of intravenous contrast. CONTRAST:  186mL ISOVUE-300 IOPAMIDOL (ISOVUE-300) INJECTION 61% COMPARISON:  04/14/2012 FINDINGS: Lower chest: 4 mm left lower lobe nodule, image 4/4, no change from 04/14/2012, considered benign. Mild scarring in the posterior basal segment right lower lobe along the hemidiaphragm. Mild circumferential wall thickening in the distal esophagus, image 5/2. Hepatobiliary: Diffuse hypodensity of the liver compatible with steatosis. Laterally in the lateral segment left hepatic lobe a 3.1 by 2.2 cm hypodense lesion is present, not appreciably changed from 2013, likely a cyst. Gallbladder unremarkable. Pancreas: Unremarkable Spleen: Unremarkable Adrenals/Urinary Tract: Unremarkable Stomach/Bowel: No identifiable appendix. On images 70-71 of series 6 there is a linear density along what appears to potentially be an appendiceal stump. Are we certain that the patient has not had prior appendectomy? I do not observe an inflamed structure in the right lower quadrant to suggest appendicitis. Several sigmoid colon diverticula did not appear inflamed. Vascular/Lymphatic: Aortoiliac atherosclerotic vascular disease. Portacaval node 1.1 cm in short axis, within normal limits for  this location. No pathologic adenopathy identified. Reproductive: Hysterectomy.  Ovaries not well seen. Other: No supplemental non-categorized findings. Musculoskeletal: Mild lumbar spondylosis and degenerative disc disease. IMPRESSION: 1. A specific cause for right lower quadrant abdominal pain is not identified. Appendix is not well seen, and is either surgically absent or adherent to the adjacent cecum in such a fashion that is not visible. 2. Mild circumferential wall thickening in the distal esophagus, esophagitis not excluded. 3. Hepatic steatosis. Stable cyst in the lateral segment left hepatic lobe. 4.  Aortoiliac atherosclerotic vascular disease. 5. Mild lumbar spondylosis and degenerative disc disease. Electronically Signed   By: Van Clines M.D.   On: 02/22/2016 18:01    ____________________________________________    PROCEDURES  Procedure(s) performed:       Medications  diatrizoate meglumine-sodium (GASTROGRAFIN) 66-10 % solution 15 mL (not administered)  morphine 4 MG/ML injection 4 mg (4 mg Intravenous Given 02/22/16 1644)  ondansetron (ZOFRAN) injection 4 mg (4 mg Intravenous Given 02/22/16 1644)  iopamidol (ISOVUE-300) 61 % injection 100 mL (100 mLs Intravenous Contrast Given 02/22/16 1723)     ____________________________________________   INITIAL IMPRESSION / ASSESSMENT AND PLAN / ED COURSE  Pertinent labs & imaging results that were available during my care of the patient were reviewed by me and considered in my medical decision making (see chart for details).  Patient's diagnosis is consistent with right lower quadrant pain and possible urinary tract infection. Labs are reassuring. Patient does have some leukocytosis in her urine. Patient denies any dysuria, polyuria, hematuria. CT scan reveals no acute abnormality to explain the symptoms. Appendix is not visualized on CT scan. Patient is requestioned after results and she states that she has not had it  removed. While patient is still tender to palpation in the right lower quadrant exam is otherwise reassuring with reassuring labs and CT scans. At this time patient will be discharged home with antibiotics treat possible urinary tract infection. Strict ED precautions are given to the patient to return for any worsening of her symptoms. She will follow-up with primary care in 3 days..     ____________________________________________  FINAL CLINICAL IMPRESSION(S) / ED DIAGNOSES  Final diagnoses:  Right lower quadrant abdominal pain  UTI (lower urinary tract infection)      NEW MEDICATIONS STARTED DURING THIS VISIT:  New Prescriptions   CIPROFLOXACIN (CIPRO) 500 MG TABLET    Take 1 tablet (500 mg total) by  mouth 2 (two) times daily.   TRAMADOL (ULTRAM) 50 MG TABLET    Take 1 tablet (50 mg total) by mouth every 6 (six) hours as needed.        This chart was dictated using voice recognition software/Dragon. Despite best efforts to proofread, errors can occur which can change the meaning. Any change was purely unintentional.    Darletta Moll, PA-C 02/22/16 1858  Daymon Larsen, MD 02/22/16 2013

## 2016-02-22 NOTE — Discharge Instructions (Signed)
Abdominal Pain, Adult °Many things can cause abdominal pain. Usually, abdominal pain is not caused by a disease and will improve without treatment. It can often be observed and treated at home. Your health care provider will do a physical exam and possibly order blood tests and X-rays to help determine the seriousness of your pain. However, in many cases, more time must pass before a clear cause of the pain can be found. Before that point, your health care provider may not know if you need more testing or further treatment. °HOME CARE INSTRUCTIONS °Monitor your abdominal pain for any changes. The following actions may help to alleviate any discomfort you are experiencing: °· Only take over-the-counter or prescription medicines as directed by your health care provider. °· Do not take laxatives unless directed to do so by your health care provider. °· Try a clear liquid diet (broth, tea, or water) as directed by your health care provider. Slowly move to a bland diet as tolerated. °SEEK MEDICAL CARE IF: °· You have unexplained abdominal pain. °· You have abdominal pain associated with nausea or diarrhea. °· You have pain when you urinate or have a bowel movement. °· You experience abdominal pain that wakes you in the night. °· You have abdominal pain that is worsened or improved by eating food. °· You have abdominal pain that is worsened with eating fatty foods. °· You have a fever. °SEEK IMMEDIATE MEDICAL CARE IF: °· Your pain does not go away within 2 hours. °· You keep throwing up (vomiting). °· Your pain is felt only in portions of the abdomen, such as the right side or the left lower portion of the abdomen. °· You pass bloody or black tarry stools. °MAKE SURE YOU: °· Understand these instructions. °· Will watch your condition. °· Will get help right away if you are not doing well or get worse. °  °This information is not intended to replace advice given to you by your health care provider. Make sure you discuss  any questions you have with your health care provider. °  °Document Released: 07/30/2005 Document Revised: 07/11/2015 Document Reviewed: 06/29/2013 °Elsevier Interactive Patient Education ©2016 Elsevier Inc. °Urinary Tract Infection °Urinary tract infections (UTIs) can develop anywhere along your urinary tract. Your urinary tract is your body's drainage system for removing wastes and extra water. Your urinary tract includes two kidneys, two ureters, a bladder, and a urethra. Your kidneys are a pair of bean-shaped organs. Each kidney is about the size of your fist. They are located below your ribs, one on each side of your spine. °CAUSES °Infections are caused by microbes, which are microscopic organisms, including fungi, viruses, and bacteria. These organisms are so small that they can only be seen through a microscope. Bacteria are the microbes that most commonly cause UTIs. °SYMPTOMS  °Symptoms of UTIs may vary by age and gender of the patient and by the location of the infection. Symptoms in young women typically include a frequent and intense urge to urinate and a painful, burning feeling in the bladder or urethra during urination. Older women and men are more likely to be tired, shaky, and weak and have muscle aches and abdominal pain. A fever may mean the infection is in your kidneys. Other symptoms of a kidney infection include pain in your back or sides below the ribs, nausea, and vomiting. °DIAGNOSIS °To diagnose a UTI, your caregiver will ask you about your symptoms. Your caregiver will also ask you to provide a urine sample. The   urine sample will be tested for bacteria and white blood cells. White blood cells are made by your body to help fight infection. °TREATMENT  °Typically, UTIs can be treated with medication. Because most UTIs are caused by a bacterial infection, they usually can be treated with the use of antibiotics. The choice of antibiotic and length of treatment depend on your symptoms and the  type of bacteria causing your infection. °HOME CARE INSTRUCTIONS °· If you were prescribed antibiotics, take them exactly as your caregiver instructs you. Finish the medication even if you feel better after you have only taken some of the medication. °· Drink enough water and fluids to keep your urine clear or pale yellow. °· Avoid caffeine, tea, and carbonated beverages. They tend to irritate your bladder. °· Empty your bladder often. Avoid holding urine for long periods of time. °· Empty your bladder before and after sexual intercourse. °· After a bowel movement, women should cleanse from front to back. Use each tissue only once. °SEEK MEDICAL CARE IF:  °· You have back pain. °· You develop a fever. °· Your symptoms do not begin to resolve within 3 days. °SEEK IMMEDIATE MEDICAL CARE IF:  °· You have severe back pain or lower abdominal pain. °· You develop chills. °· You have nausea or vomiting. °· You have continued burning or discomfort with urination. °MAKE SURE YOU:  °· Understand these instructions. °· Will watch your condition. °· Will get help right away if you are not doing well or get worse. °  °This information is not intended to replace advice given to you by your health care provider. Make sure you discuss any questions you have with your health care provider. °  °Document Released: 07/30/2005 Document Revised: 07/11/2015 Document Reviewed: 11/28/2011 °Elsevier Interactive Patient Education ©2016 Elsevier Inc. ° °

## 2016-02-22 NOTE — ED Notes (Signed)
Pt to ed with c/o right side abd pain and nausea x 3 days, sent from md office for eval to rule out appendicitis.

## 2016-04-23 ENCOUNTER — Other Ambulatory Visit: Payer: Self-pay | Admitting: Family Medicine

## 2016-04-23 DIAGNOSIS — Z1231 Encounter for screening mammogram for malignant neoplasm of breast: Secondary | ICD-10-CM

## 2016-06-16 ENCOUNTER — Ambulatory Visit: Payer: Commercial Managed Care - PPO | Admitting: General Surgery

## 2016-06-24 ENCOUNTER — Ambulatory Visit
Admission: RE | Admit: 2016-06-24 | Discharge: 2016-06-24 | Disposition: A | Discharge status: Home / Self Care | Payer: Commercial Managed Care - PPO | Source: Ambulatory Visit | Attending: General Surgery | Admitting: General Surgery

## 2016-06-24 ENCOUNTER — Ambulatory Visit (INDEPENDENT_AMBULATORY_CARE_PROVIDER_SITE_OTHER): Payer: Commercial Managed Care - PPO | Admitting: General Surgery

## 2016-06-24 ENCOUNTER — Telehealth: Payer: Self-pay | Admitting: *Deleted

## 2016-06-24 VITALS — BP 130/74 | HR 78 | Resp 14 | Ht 60.0 in | Wt 228.0 lb

## 2016-06-24 DIAGNOSIS — M79662 Pain in left lower leg: Secondary | ICD-10-CM | POA: Diagnosis present

## 2016-06-24 DIAGNOSIS — I868 Varicose veins of other specified sites: Secondary | ICD-10-CM

## 2016-06-24 DIAGNOSIS — I8390 Asymptomatic varicose veins of unspecified lower extremity: Secondary | ICD-10-CM

## 2016-06-24 NOTE — Progress Notes (Signed)
Patient ID: Sara Rhodes, female   DOB: 1955/11/30, 60 y.o.   MRN: [id number]  Chief Complaint  Patient presents with  . Mass    left leg    HPI Sara Rhodes is a 60 y.o. female.  Here for evaluation of a knot left leg. She states she fell last month through the [employer]. She states the swelling and tenderness is near the anterior ankle. She states she did not have a laceration from the injury.  She states the swelling seems to be moving more up her leg and on the back side. Denies any redness.  She has a known history of varicose veins. She reports that she is wearing her compression stockings. I have reviewed the history of present illness with the patient.  HPI  Past Medical History:  Diagnosis Date  . Acid reflux   . Behcet's disease ([place])   . Family history of anesthesia complication    N/V daughter  . Fatty liver   . Hyperlipemia   . Hypertension   . Obesity   . Schatzki's ring   . Tobacco use   . Vaginal atrophy   . Vulvar itching     Past Surgical History:  Procedure Laterality Date  . ABDOMINAL HYSTERECTOMY     total- lsh bso- fibroids  . BACK SURGERY    . CESAREAN SECTION    . COLONOSCOPY WITH PROPOFOL N/A 12/31/2015   Procedure: COLONOSCOPY WITH PROPOFOL;  Surgeon: Manya Silvas, MD;  Location[facility] END[facility]COPY;  Service: Endoscopy;  Laterality: N/A;  . ESOPHAGOGASTRODUODENOSCOPY (EGD) WITH PROPOFOL N/A 12/31/2015   Procedure: ESOPHAGOGASTRODUODENOSCOPY (EGD) WITH PROPOFOL;  Surgeon: Manya Silvas, MD;  Location: [facility] ENDOSCOPY;  Service: Endoscopy;  Laterality: N/A;  . THORACIC DISCECTOMY Right 03/14/2013   Procedure: THORACIC DISCECTOMY;  Surgeon: Charlie Pitter, MD;  Location: [place] NEURO ORS;  Service: Neurosurgery;  Laterality: Right;  Thoracic four-five and thoracic nine-ten laminotomy with transpedicular microdiskectomy    Family History  Problem Relation Age of Onset  . Cancer Neg Hx   . Diabetes Neg Hx   . Heart disease Neg Hx   .  Crohn's disease Daughter     Social History Social History  Substance Use Topics  . Smoking status: Current Every Day Smoker    Packs/day: 0.50    Years: 10.00    Types: Cigarettes  . Smokeless tobacco: Never Used  . Alcohol use No    Allergies  Allergen Reactions  . Ibuprofen Other (See Comments)  . Latex Itching and Rash  . Penicillins Itching and Rash    Current Outpatient Prescriptions  Medication Sig Dispense Refill  . amLODipine-benazepril (LOTREL) 5-20 MG per capsule Take 1 capsule by mouth at bedtime.    . hydrochlorothiazide (HYDRODIURIL) 25 MG tablet Take 25 mg by mouth daily.    . Naproxen Sodium (ALEVE PO) Take 1 tablet by mouth as needed.    Marland Kitchen omeprazole (PRILOSEC) 20 MG capsule Take 20 mg by mouth daily.    . simvastatin (ZOCOR) 20 MG tablet Take 20 mg by mouth at bedtime.     No current facility-administered medications for this visit.     Review of Systems Review of Systems  Constitutional: Negative.   Respiratory: Negative.   Cardiovascular: Negative.     Blood pressure 130/74, pulse 78, resp. rate 14, height 5' (1.524 m), weight 228 lb (103.4 kg).  Physical Exam Physical Exam  Constitutional: She is oriented to person, place, and time. She appears well-developed  and well-nourished.  Cardiovascular:  Pulses:      Dorsalis pedis pulses are 2+ on the right side, and 2+ on the left side.       Posterior tibial pulses are 2+ on the right side, and 2+ on the left side.  Tender along left fibula   Neurological: She is alert and oriented to person, place, and time.  Skin: Skin is warm and dry.  Psychiatric: Her behavior is normal.    Data Reviewed Prior notes.  Assessment      Left lower leg pain, history of trauma. History varicose veins under good control with the use of compression hose.  Has abnormal reflux in right GSV -lateral branch  Given the history of trauma and pain along fibula Xray recommended to r/o fracture. Xray was completed  and showed no fracture  Plan      Continue compression hose. Follow up in 3 weeks.     This information has been scribed by Karie Fetch RN, BSN,BC.   Syble Picco G 06/26/2016, 8:44 AM

## 2016-06-24 NOTE — Patient Instructions (Addendum)
The patient is aware to call back for any questions or concerns.  Left lower leg and ankle x ray to rule out fracture.

## 2016-06-24 NOTE — Telephone Encounter (Signed)
-----   Message from Christene Lye, MD sent at 06/24/2016  3:31 PM EDT ----- Inform pt xrays are normal. F/u as scheduled

## 2016-06-24 NOTE — Progress Notes (Signed)
Inform pt xrays are normal. F/u as scheduled

## 2016-06-24 NOTE — Telephone Encounter (Signed)
Notified patient as instructed, patient pleased. Discussed follow-up appointments, patient agrees  

## 2016-07-15 ENCOUNTER — Ambulatory Visit: Payer: Commercial Managed Care - PPO | Admitting: General Surgery

## 2016-11-22 ENCOUNTER — Encounter: Payer: Self-pay | Admitting: Emergency Medicine

## 2016-11-22 ENCOUNTER — Ambulatory Visit
Admission: EM | Admit: 2016-11-22 | Discharge: 2016-11-22 | Disposition: A | Discharge status: Home / Self Care | Payer: Commercial Managed Care - PPO | Attending: Family Medicine | Admitting: Family Medicine

## 2016-11-22 DIAGNOSIS — J0100 Acute maxillary sinusitis, unspecified: Secondary | ICD-10-CM | POA: Diagnosis not present

## 2016-11-22 MED ORDER — AZITHROMYCIN 250 MG PO TABS: 250.0000 mg | ORAL_TABLET | Freq: Every day | ORAL | 0 refills | Status: DC

## 2016-11-22 NOTE — ED Provider Notes (Signed)
CSN: [id number]     Arrival date & time 11/22/16  0930 History   First MD Initiated Contact with Patient 11/22/16 1102     Chief Complaint  Patient presents with  . Sinus Problem  . Headache   (Consider location/radiation/quality/duration/timing/severity/associated sxs/prior Treatment) HPI: 61 Y/O female presented with CC of nasal congestion, facial pain and pressure x 7 days ,gradually worsening. Denies fever/chills. Reports taking OTC allergy medication and flonase with no relief but flonase resulted in nose bleed.   Past Medical History:  Diagnosis Date  . Acid reflux   . Behcet's disease ([place])   . Family history of anesthesia complication    N/V daughter  . Fatty liver   . Hyperlipemia   . Hypertension   . Obesity   . Schatzki's ring   . Tobacco use   . Vaginal atrophy   . Vulvar itching    Past Surgical History:  Procedure Laterality Date  . ABDOMINAL HYSTERECTOMY     total- lsh bso- fibroids  . BACK SURGERY    . CESAREAN SECTION    . COLONOSCOPY WITH PROPOFOL N/A 12/31/2015   Procedure: COLONOSCOPY WITH PROPOFOL;  Surgeon: Manya Silvas, MD;  Location: [facility] ENDOSCOPY;  Service: Endoscopy;  Laterality: N/A;  . ESOPHAGOGASTRODUODENOSCOPY (EGD) WITH PROPOFOL N/A 12/31/2015   Procedure: ESOPHAGOGASTRODUODENOSCOPY (EGD) WITH PROPOFOL;  Surgeon: Manya Silvas, MD;  Location: [facility] ENDOSCOPY;  Service: Endoscopy;  Laterality: N/A;  . THORACIC DISCECTOMY Right 03/14/2013   Procedure: THORACIC DISCECTOMY;  Surgeon: Charlie Pitter, MD;  Location: [place] NEURO ORS;  Service: Neurosurgery;  Laterality: Right;  Thoracic four-five and thoracic nine-ten laminotomy with transpedicular microdiskectomy   Family History  Problem Relation Age of Onset  . Crohn's disease Daughter   . Cancer Neg Hx   . Diabetes Neg Hx   . Heart disease Neg Hx    Social History  Substance Use Topics  . Smoking status: Current Every Day Smoker    Packs/day: 0.50    Years: 10.00    Types: Cigarettes  .  Smokeless tobacco: Never Used  . Alcohol use No   OB History    Gravida Para Term Preterm AB Living   3 3 3  0 0 3   SAB TAB Ectopic Multiple Live Births   0 0 0 0 3      Obstetric Comments   1st Menstrual Cycle: 12 1st Pregnancy: 25     Review of Systems  HENT: Positive for congestion, postnasal drip and sinus pressure.     Allergies  Ibuprofen; Latex; and Penicillins  Home Medications   Prior to Admission medications   Medication Sig Start Date End Date Taking? Authorizing Provider  amLODipine-benazepril (LOTREL) 5-20 MG per capsule Take 1 capsule by mouth at bedtime.    Historical Provider, MD  azithromycin (ZITHROMAX) 250 MG tablet Take 1 tablet (250 mg total) by mouth daily. Take first 2 tablets together, then 1 every day until finished. 11/22/16   Lynnex Fulp, NP  hydrochlorothiazide (HYDRODIURIL) 25 MG tablet Take 25 mg by mouth daily.    Historical Provider, MD  Naproxen Sodium (ALEVE PO) Take 1 tablet by mouth as needed.    Historical Provider, MD  omeprazole (PRILOSEC) 20 MG capsule Take 20 mg by mouth daily.    Historical Provider, MD  simvastatin (ZOCOR) 20 MG tablet Take 20 mg by mouth at bedtime.    Historical Provider, MD   Meds Ordered and Administered this Visit  Medications - No data to display  BP 138/70 (BP Location: Left Arm)   Pulse 62   Temp 98 F (36.7 C) (Oral)   Resp 16   Ht 5\' 2"  (1.575 m)   Wt 224 lb (101.6 kg)   SpO2 100%   BMI 40.97 kg/m  No data found.   Physical Exam  Constitutional: She appears well-developed and well-nourished.  HENT:  Nose: Right sinus exhibits maxillary sinus tenderness and frontal sinus tenderness. Left sinus exhibits maxillary sinus tenderness and frontal sinus tenderness.  Pulmonary/Chest: Effort normal and breath sounds normal. No respiratory distress. She has no wheezes.    Urgent Care Course     Procedures (including critical care time)  Labs Review Labs Reviewed - No data to display  Imaging  Review No results found.   Visual Acuity Review  Right Eye Distance:   Left Eye Distance:   Bilateral Distance:    Right Eye Near:   Left Eye Near:    Bilateral Near:         MDM   1. Acute non-recurrent maxillary sinusitis   Sx consistent with sinusitis. Tx with course of Z Pack. Advise to use saline nasal rinse and Allegra OTC. FU PCP as needed.    Duval Macleod, NP 11/22/16 1116

## 2016-11-22 NOTE — ED Triage Notes (Signed)
Patient c/o sinus congestion and pressure, HAs, and nasal congestion for over a week.

## 2016-11-22 NOTE — Discharge Instructions (Signed)
Use nasal saline rinse and Allegra OTC as advised.

## 2017-01-16 NOTE — Progress Notes (Signed)
ANNUAL PREVENTATIVE CARE GYN  ENCOUNTER NOTE  Subjective:       Sara Rhodes is a 61 y.o. G37P3003 female here for a routine annual gynecologic exam.    Current complaints:   1. Annual Exam for 61 yo female  2. RLQ Pain  3. Difficulty emptying bladder   4. Menopause, symptomatic not desiring HRT    Gynecologic History No LMP recorded. Patient has had a hysterectomy. Contraception: hysterectomy Last Pap: 11/2013 neg/neg. Results were: normal Last mammogram: 2015. Results were: left breast calcifications at 11:00- needed f/u left in 6 months?  Patient in menopausal state, mildly symptomatic, does not desire HRT.   Obstetric History OB History  Gravida Para Term Preterm AB Living  3 3 3  0 0 3  SAB TAB Ectopic Multiple Live Births  0 0 0 0 3    # Outcome Date GA Lbr Len/2nd Weight Sex Delivery Anes PTL Lv  3 Term    8 lb (3.629 kg) F CS-LTranv   LIV  2 Term    8 lb (3.629 kg) F CS-LTranv   LIV  1 Term    8 lb 9.6 oz (3.901 kg) M Vag-Spont   LIV    Obstetric Comments  1st Menstrual Cycle: 12  1st Pregnancy: 25    Past Medical History:  Diagnosis Date  . Acid reflux   . Behcet's disease ([place])   . Family history of anesthesia complication    N/V daughter  . Fatty liver   . Hyperlipemia   . Hypertension   . Obesity   . Schatzki's ring   . Tobacco use   . Vaginal atrophy   . Vulvar itching     Past Surgical History:  Procedure Laterality Date  . ABDOMINAL HYSTERECTOMY     total- lsh bso- fibroids  . BACK SURGERY    . CESAREAN SECTION    . COLONOSCOPY WITH PROPOFOL N/A 12/31/2015   Procedure: COLONOSCOPY WITH PROPOFOL;  Surgeon: Manya Silvas, MD;  Location: [facility] ENDOSCOPY;  Service: Endoscopy;  Laterality: N/A;  . ESOPHAGOGASTRODUODENOSCOPY (EGD) WITH PROPOFOL N/A 12/31/2015   Procedure: ESOPHAGOGASTRODUODENOSCOPY (EGD) WITH PROPOFOL;  Surgeon: Manya Silvas, MD;  Location: [facility] ENDOSCOPY;  Service: Endoscopy;  Laterality: N/A;  . THORACIC DISCECTOMY  Right 03/14/2013   Procedure: THORACIC DISCECTOMY;  Surgeon: Charlie Pitter, MD;  Location: [place] NEURO ORS;  Service: Neurosurgery;  Laterality: Right;  Thoracic four-five and thoracic nine-ten laminotomy with transpedicular microdiskectomy    Current Outpatient Prescriptions on File Prior to Visit  Medication Sig Dispense Refill  . amLODipine-benazepril (LOTREL) 5-20 MG per capsule Take 1 capsule by mouth at bedtime.    Marland Kitchen azithromycin (ZITHROMAX) 250 MG tablet Take 1 tablet (250 mg total) by mouth daily. Take first 2 tablets together, then 1 every day until finished. 6 tablet 0  . hydrochlorothiazide (HYDRODIURIL) 25 MG tablet Take 25 mg by mouth daily.    . Naproxen Sodium (ALEVE PO) Take 1 tablet by mouth as needed.    Marland Kitchen omeprazole (PRILOSEC) 20 MG capsule Take 20 mg by mouth daily.    . simvastatin (ZOCOR) 20 MG tablet Take 20 mg by mouth at bedtime.     No current facility-administered medications on file prior to visit.     Allergies  Allergen Reactions  . Ibuprofen Other (See Comments)  . Latex Itching and Rash  . Penicillins Itching and Rash    Social History   Social History  . Marital status: Married  Spouse name: N/A  . Number of children: N/A  . Years of education: N/A   Occupational History  . Not on file.   Social History Main Topics  . Smoking status: Current Every Day Smoker    Packs/day: 0.50    Years: 10.00    Types: Cigarettes  . Smokeless tobacco: Never Used  . Alcohol use No  . Drug use: No  . Sexual activity: Yes    Birth control/ protection: Surgical   Other Topics Concern  . Not on file   Social History Narrative  . No narrative on file    Family History  Problem Relation Age of Onset  . Crohn's disease Daughter   . Cancer Neg Hx   . Diabetes Neg Hx   . Heart disease Neg Hx     The following portions of the patient's history were reviewed and updated as appropriate: allergies, current medications, past family history, past medical  history, past social history, past surgical history and problem list.  Review of Systems Review of Systems  Constitutional: Negative for chills, fever, malaise/fatigue and weight loss.  HENT: Negative for congestion, sinus pain and sore throat.   Eyes: Negative.   Respiratory: Negative for cough, sputum production, shortness of breath and wheezing.   Cardiovascular: Negative for chest pain and palpitations.  Gastrointestinal: Positive for abdominal pain and heartburn. Negative for blood in stool, constipation, diarrhea, melena, nausea and vomiting.       RLQ pain  Genitourinary: Negative for dysuria, flank pain, frequency, hematuria and urgency.       Incomplete bladder emptying: has to change position  Musculoskeletal: Negative for myalgias.  Neurological: Negative for dizziness, tingling and headaches.     Objective:   BP 130/82   Pulse (!) 55   Ht 5\' 2"  (1.575 m)   Wt 224 lb (101.6 kg)   BMI 40.97 kg/m CONSTITUTIONAL: Well-developed, well-nourished female in no acute distress.  PSYCHIATRIC: Normal mood and affect. Normal behavior. Normal judgment and thought content. [place]: Alert and oriented to person, place, and time. Normal muscle tone coordination. No cranial nerve deficit noted. HENT:  Normocephalic, atraumatic, External right and left ear normal. Oropharynx is clear and moist EYES: Conjunctivae and EOM are normal. Pupils are equal, round, and reactive to light. No scleral icterus.  NECK: Normal range of motion, supple, no masses.  Normal thyroid.  SKIN: Skin is warm and dry. No rash noted. Not diaphoretic. No erythema. No pallor. CARDIOVASCULAR: Normal heart rate noted, regular rhythm, no murmur. RESPIRATORY: Clear to auscultation bilaterally. Effort and breath sounds normal, no problems with respiration noted. BREASTS: Symmetric in size. No masses, skin changes, nipple drainage, or lymphadenopathy. ABDOMEN: Soft, normal bowel sounds, no distention noted.  No  tenderness, rebound or guarding. RLQ palpation did not elicit any pain or reaction from patient. Midline incision, well-healed, no hernia, no evidence of rash or irritation under pannus.  BLADDER: Normal PELVIC:  External Genitalia: Normal  BUS: Normal  Vagina: Mild atrophy, mild reduction in rugae along vaginal wall; no significant cystocele or rectocele  Cervix: Normal, no cervical motion tenderness; no prolapse  Uterus: Surgically absent   Adnexa: Normal, non-palpable, no tenderness, b/l ovaries surgically absent   RV: External Exam NormaI, internal rectal exam no evidence of rectal masses, normal sphincter tone  MUSCULOSKELETAL: Normal range of motion. No tenderness.  No cyanosis, clubbing, or edema.  2+ distal pulses. LYMPHATIC: No Axillary, Supraclavicular, or Inguinal Adenopathy.   Assessment:   1. Annual gynecologic examination 60  y.o. 2. Contraception: status post hysterectomy 3. Obese, BMI-40  4. Menopause, minimally symptomatic  5. Difficulty emptying bladder   Plan:  1. Pap Smear Performed: pap w/hpv 2. Mammogram: Ordered 3. Stool Guaiac Testing: colonoscopy 12/2015 4. Routine preventative health maintenance measures emphasized:   Exercise/Diet/Weight control and Tobacco Warnings.     Patient recommended to continue daily exercise and dietary restriction; weight loss encouraged due to increased BMI.  5. Importance of smoking cessation emphasized. 6. Continue vitamin D supplementation, add Tums for increased absorption.  7. Return to [place]  8. Set up PCP referral to [facility] for HTN labs  9. Urinalysis with urine culture to rule out UTI   Joyice Faster, CMA    [employer], PA-S   [employer]   Brayton Mars, MD    I have seen, interviewed, and examined the patient in conjunction with the [facility].A. student and affirm the diagnosis and management plan. Deidre Carino A. Redina Zeller, MD, FACOG   Note: This dictation  was prepared with Dragon dictation along with smaller phrase technology. Any transcriptional errors that result from this process are unintentional.

## 2017-01-20 ENCOUNTER — Encounter: Payer: Commercial Managed Care - PPO | Admitting: Obstetrics and Gynecology

## 2017-01-21 ENCOUNTER — Encounter: Payer: Self-pay | Admitting: Obstetrics and Gynecology

## 2017-01-21 ENCOUNTER — Ambulatory Visit (INDEPENDENT_AMBULATORY_CARE_PROVIDER_SITE_OTHER): Payer: Commercial Managed Care - PPO | Admitting: Obstetrics and Gynecology

## 2017-01-21 VITALS — BP 130/82 | HR 55 | Ht 62.0 in | Wt 224.0 lb

## 2017-01-21 DIAGNOSIS — R300 Dysuria: Secondary | ICD-10-CM

## 2017-01-21 DIAGNOSIS — R638 Other symptoms and signs concerning food and fluid intake: Secondary | ICD-10-CM

## 2017-01-21 DIAGNOSIS — I10 Essential (primary) hypertension: Secondary | ICD-10-CM

## 2017-01-21 DIAGNOSIS — Z1231 Encounter for screening mammogram for malignant neoplasm of breast: Secondary | ICD-10-CM

## 2017-01-21 DIAGNOSIS — Z90710 Acquired absence of both cervix and uterus: Secondary | ICD-10-CM | POA: Diagnosis not present

## 2017-01-21 DIAGNOSIS — Z780 Asymptomatic menopausal state: Secondary | ICD-10-CM | POA: Diagnosis not present

## 2017-01-21 DIAGNOSIS — Z01419 Encounter for gynecological examination (general) (routine) without abnormal findings: Secondary | ICD-10-CM | POA: Diagnosis not present

## 2017-01-21 DIAGNOSIS — Z90721 Acquired absence of ovaries, unilateral: Secondary | ICD-10-CM

## 2017-01-21 DIAGNOSIS — R1031 Right lower quadrant pain: Secondary | ICD-10-CM

## 2017-01-21 DIAGNOSIS — Z1239 Encounter for other screening for malignant neoplasm of breast: Secondary | ICD-10-CM

## 2017-01-21 LAB — POCT URINALYSIS DIPSTICK
Bilirubin, UA: NEGATIVE
Glucose, UA: NEGATIVE
KETONES UA: NEGATIVE
Leukocytes, UA: NEGATIVE
Nitrite, UA: NEGATIVE
Protein, UA: NEGATIVE
RBC UA: NEGATIVE
SPEC GRAV UA: 1.005 (ref 1.030–1.035)
Urobilinogen, UA: NEGATIVE (ref ?–2.0)
pH, UA: 6.5 (ref 5.0–8.0)

## 2017-01-21 NOTE — Patient Instructions (Addendum)
1. Pap smear is done 2. Mammogram ordered 3. Stool guaiac cards are deferred due to colonoscopy in 2017 4. He with healthy eating, exercise, and controlled weight loss 5. Recommend calcium with vitamin D supplementation twice a day 6. Return in 1 year for annual exam 7. Urinalysis and urine culture are obtained 8. Referral to separate [facility] for establishment of care and follow-up on hypertension   Health Maintenance for Postmenopausal Women Menopause is a normal process in which your reproductive ability comes to an end. This process happens gradually over a span of months to years, usually between the ages of 62 and 72. Menopause is complete when you have missed 12 consecutive menstrual periods. It is important to talk with your health care provider about some of the most common conditions that affect postmenopausal women, such as heart disease, cancer, and bone loss (osteoporosis). Adopting a healthy lifestyle and getting preventive care can help to promote your health and wellness. Those actions can also lower your chances of developing some of these common conditions. What should I know about menopause? During menopause, you may experience a number of symptoms, such as:  Moderate-to-severe hot flashes.  Night sweats.  Decrease in sex drive.  Mood swings.  Headaches.  Tiredness.  Irritability.  Memory problems.  Insomnia. Choosing to treat or not to treat menopausal changes is an individual decision that you make with your health care provider. What should I know about hormone replacement therapy and supplements? Hormone therapy products are effective for treating symptoms that are associated with menopause, such as hot flashes and night sweats. Hormone replacement carries certain risks, especially as you become older. If you are thinking about using estrogen or estrogen with progestin treatments, discuss the benefits and risks with your health care provider. What  should I know about heart disease and stroke? Heart disease, heart attack, and stroke become more likely as you age. This may be due, in part, to the hormonal changes that your body experiences during menopause. These can affect how your body processes dietary fats, triglycerides, and cholesterol. Heart attack and stroke are both medical emergencies. There are many things that you can do to help prevent heart disease and stroke:  Have your blood pressure checked at least every 1-2 years. High blood pressure causes heart disease and increases the risk of stroke.  If you are 61-39 years old, ask your health care provider if you should take aspirin to prevent a heart attack or a stroke.  Do not use any tobacco products, including cigarettes, chewing tobacco, or electronic cigarettes. If you need help quitting, ask your health care provider.  It is important to eat a healthy diet and maintain a healthy weight.  Be sure to include plenty of vegetables, fruits, low-fat dairy products, and lean protein.  Avoid eating foods that are high in solid fats, added sugars, or salt (sodium).  Get regular exercise. This is one of the most important things that you can do for your health.  Try to exercise for at least 150 minutes each week. The type of exercise that you do should increase your heart rate and make you sweat. This is known as moderate-intensity exercise.  Try to do strengthening exercises at least twice each week. Do these in addition to the moderate-intensity exercise.  Know your numbers.Ask your health care provider to check your cholesterol and your blood glucose. Continue to have your blood tested as directed by your health care provider. What should I know about cancer  screening? There are several types of cancer. Take the following steps to reduce your risk and to catch any cancer development as early as possible. Breast Cancer  Practice breast self-awareness.  This means  understanding how your breasts normally appear and feel.  It also means doing regular breast self-exams. Let your health care provider know about any changes, no matter how small.  If you are 61 or older, have a clinician do a breast exam (clinical breast exam or CBE) every year. Depending on your age, family history, and medical history, it may be recommended that you also have a yearly breast X-ray (mammogram).  If you have a family history of breast cancer, talk with your health care provider about genetic screening.  If you are at high risk for breast cancer, talk with your health care provider about having an MRI and a mammogram every year.  Breast cancer (BRCA) gene test is recommended for women who have family members with BRCA-related cancers. Results of the assessment will determine the need for genetic counseling and BRCA1 and for BRCA2 testing. BRCA-related cancers include these types:  Breast. This occurs in males or females.  Ovarian.  Tubal. This may also be called fallopian tube cancer.  Cancer of the abdominal or pelvic lining (peritoneal cancer).  Prostate.  Pancreatic. Cervical, Uterine, and Ovarian Cancer  Your health care provider may recommend that you be screened regularly for cancer of the pelvic organs. These include your ovaries, uterus, and vagina. This screening involves a pelvic exam, which includes checking for microscopic changes to the surface of your cervix (Pap test).  For women ages 21-65, health care providers may recommend a pelvic exam and a Pap test every three years. For women ages 31-65, they may recommend the Pap test and pelvic exam, combined with testing for human papilloma virus (HPV), every five years. Some types of HPV increase your risk of cervical cancer. Testing for HPV may also be done on women of any age who have unclear Pap test results.  Other health care providers may not recommend any screening for nonpregnant women who are considered  low risk for pelvic cancer and have no symptoms. Ask your health care provider if a screening pelvic exam is right for you.  If you have had past treatment for cervical cancer or a condition that could lead to cancer, you need Pap tests and screening for cancer for at least 20 years after your treatment. If Pap tests have been discontinued for you, your risk factors (such as having a new sexual partner) need to be reassessed to determine if you should start having screenings again. Some women have medical problems that increase the chance of getting cervical cancer. In these cases, your health care provider may recommend that you have screening and Pap tests more often.  If you have a family history of uterine cancer or ovarian cancer, talk with your health care provider about genetic screening.  If you have vaginal bleeding after reaching menopause, tell your health care provider.  There are currently no reliable tests available to screen for ovarian cancer. Lung Cancer  Lung cancer screening is recommended for adults 43-71 years old who are at high risk for lung cancer because of a history of smoking. A yearly low-dose CT scan of the lungs is recommended if you:  Currently smoke.  Have a history of at least 30 pack-years of smoking and you currently smoke or have quit within the past 15 years. A pack-year is smoking  an average of one pack of cigarettes per day for one year. Yearly screening should:  Continue until it has been 15 years since you quit.  Stop if you develop a health problem that would prevent you from having lung cancer treatment. Colorectal Cancer  This type of cancer can be detected and can often be prevented.  Routine colorectal cancer screening usually begins at age 61 and continues through age 10.  If you have risk factors for colon cancer, your health care provider may recommend that you be screened at an earlier age.  If you have a family history of colorectal  cancer, talk with your health care provider about genetic screening.  Your health care provider may also recommend using home test kits to check for hidden blood in your stool.  A small camera at the end of a tube can be used to examine your colon directly (sigmoidoscopy or colonoscopy). This is done to check for the earliest forms of colorectal cancer.  Direct examination of the colon should be repeated every 5-10 years until age 19. However, if early forms of precancerous polyps or small growths are found or if you have a family history or genetic risk for colorectal cancer, you may need to be screened more often. Skin Cancer  Check your skin from head to toe regularly.  Monitor any moles. Be sure to tell your health care provider:  About any new moles or changes in moles, especially if there is a change in a mole's shape or color.  If you have a mole that is larger than the size of a pencil eraser.  If any of your family members has a history of skin cancer, especially at a young age, talk with your health care provider about genetic screening.  Always use sunscreen. Apply sunscreen liberally and repeatedly throughout the day.  Whenever you are outside, protect yourself by wearing long sleeves, pants, a wide-brimmed hat, and sunglasses. What should I know about osteoporosis? Osteoporosis is a condition in which bone destruction happens more quickly than new bone creation. After menopause, you may be at an increased risk for osteoporosis. To help prevent osteoporosis or the bone fractures that can happen because of osteoporosis, the following is recommended:  If you are 31-37 years old, get at least 1,000 mg of calcium and at least 600 mg of vitamin D per day.  If you are older than age 72 but younger than age 84, get at least 1,200 mg of calcium and at least 600 mg of vitamin D per day.  If you are older than age 63, get at least 1,200 mg of calcium and at least 800 mg of vitamin D  per day. Smoking and excessive alcohol intake increase the risk of osteoporosis. Eat foods that are rich in calcium and vitamin D, and do weight-bearing exercises several times each week as directed by your health care provider. What should I know about how menopause affects my mental health? Depression may occur at any age, but it is more common as you become older. Common symptoms of depression include:  Low or sad mood.  Changes in sleep patterns.  Changes in appetite or eating patterns.  Feeling an overall lack of motivation or enjoyment of activities that you previously enjoyed.  Frequent crying spells. Talk with your health care provider if you think that you are experiencing depression. What should I know about immunizations? It is important that you get and maintain your immunizations. These include:  Tetanus, diphtheria,  and pertussis (Tdap) booster vaccine.  Influenza every year before the flu season begins.  Pneumonia vaccine.  Shingles vaccine. Your health care provider may also recommend other immunizations. This information is not intended to replace advice given to you by your health care provider. Make sure you discuss any questions you have with your health care provider. Document Released: 12/12/2005 Document Revised: 05/09/2016 Document Reviewed: 07/24/2015 Elsevier Interactive Patient Education  2017 [employer].

## 2017-01-23 LAB — PAP IG AND HPV HIGH-RISK
HPV, HIGH-RISK: NEGATIVE
PAP SMEAR COMMENT: 0

## 2017-01-23 LAB — URINE CULTURE: ORGANISM ID, BACTERIA: NO GROWTH

## 2017-03-31 ENCOUNTER — Encounter: Payer: Self-pay | Admitting: Obstetrics and Gynecology

## 2017-04-06 ENCOUNTER — Ambulatory Visit: Payer: Commercial Managed Care - PPO | Admitting: Family Medicine

## 2017-08-06 ENCOUNTER — Telehealth: Payer: Self-pay | Admitting: *Deleted

## 2017-08-06 ENCOUNTER — Encounter: Payer: Self-pay | Admitting: General Surgery

## 2017-08-06 ENCOUNTER — Ambulatory Visit (INDEPENDENT_AMBULATORY_CARE_PROVIDER_SITE_OTHER): Payer: Commercial Managed Care - PPO | Admitting: General Surgery

## 2017-08-06 VITALS — BP 142/78 | HR 72 | Resp 12 | Ht 62.0 in | Wt 202.0 lb

## 2017-08-06 DIAGNOSIS — I8001 Phlebitis and thrombophlebitis of superficial vessels of right lower extremity: Secondary | ICD-10-CM | POA: Diagnosis not present

## 2017-08-06 DIAGNOSIS — I8393 Asymptomatic varicose veins of bilateral lower extremities: Secondary | ICD-10-CM | POA: Diagnosis not present

## 2017-08-06 NOTE — Telephone Encounter (Signed)
Patient had called answering service and left message that there was a spot on her leg that was sore. I left message that recommend she has an appointment since it had been a year from her last visit.

## 2017-08-06 NOTE — Telephone Encounter (Signed)
She states she has noticed a hard knot that is tender, no swelling. She thinks possible bite mark and she scratched that area. She states it is a different location than her previous injury but she knows her varicose veins ar bad. Appointment per [facility]

## 2017-08-06 NOTE — Patient Instructions (Addendum)
The patient is aware to call back for any questions or concerns. Continue compression hose. Recommend Aleve or advil for 5-7 days. The patient is aware to use a heating pad as needed for comfort. She was advised to seek medical attention if symptoms worsen   Phlebitis Phlebitis is soreness and puffiness (swelling) in a vein. Follow these instructions at home:  Only take medicine as told by your doctor.  Raise (elevate) the affected limb on a pillow as told by your doctor.  Keep a warm pack on the affected vein as told by your doctor. Do not sleep with a heating pad.  Use special stockings or bandages around the area of the affected vein as told by your doctor. These will speed healing and keep the condition from coming back.  Talk to your doctor about all the medicines you take.  Get follow-up blood tests as told by your doctor.  If the phlebitis is in your legs: ? Avoid standing or resting for long periods. ? Keep your legs moving. Raise your legs when you sit or lie.  Do not smoke.  Follow-up with your doctor as told. Contact a doctor if:  You have strange bruises or bleeding.  Your puffiness or pain in the affected area is not getting better.  You are taking medicine to lessen puffiness (anti-inflammatory medicine), and you get belly pain.  You have a fever. Get help right away if:  The phlebitis gets worse and you have more pain, puffiness (swelling), or redness.  You have trouble breathing or have chest pain. This information is not intended to replace advice given to you by your health care provider. Make sure you discuss any questions you have with your health care provider. Document Released: 10/08/2009 Document Revised: 03/27/2016 Document Reviewed: 06/27/2013 Elsevier Interactive Patient Education  2017 [employer].

## 2017-08-06 NOTE — Progress Notes (Signed)
Patient ID: Sara Rhodes, female   DOB: November 10, 1955, 61 y.o.   MRN: [id number]  Chief Complaint  Patient presents with  . Other    knot on leg    HPI Sara Rhodes is a 61 y.o. female.  Her for evaluation of a knot on herright leg calf area. She states she has noticed a hard knot that is tender, no swelling. Tenderness for 2 days. She thinks possible bite mark and she scratched that area. She states it is a different location than her previous injury but she knows her varicose veins are bad. She was last seen in August 2017 after a traumatic injury to her left leg. She has lost 26 pounds on the Keto diet. She wears her compression hose except for extremely hot days.  HPI  Past Medical History:  Diagnosis Date  . Acid reflux   . Behcet's disease ([place])   . Family history of anesthesia complication    N/V daughter  . Fatty liver   . Hyperlipemia   . Hypertension   . Obesity   . Schatzki's ring   . Tobacco use   . Vaginal atrophy   . Vulvar itching     Past Surgical History:  Procedure Laterality Date  . ABDOMINAL HYSTERECTOMY     total- lsh bso- fibroids  . BACK SURGERY    . CESAREAN SECTION    . COLONOSCOPY WITH PROPOFOL N/A 12/31/2015   Procedure: COLONOSCOPY WITH PROPOFOL;  Surgeon: Manya Silvas, MD;  Location: [facility] ENDOSCOPY;  Service: Endoscopy;  Laterality: N/A;  . ESOPHAGOGASTRODUODENOSCOPY (EGD) WITH PROPOFOL N/A 12/31/2015   Procedure: ESOPHAGOGASTRODUODENOSCOPY (EGD) WITH PROPOFOL;  Surgeon: Manya Silvas, MD;  Location: [facility] ENDOSCOPY;  Service: Endoscopy;  Laterality: N/A;  . THORACIC DISCECTOMY Right 03/14/2013   Procedure: THORACIC DISCECTOMY;  Surgeon: Charlie Pitter, MD;  Location: [facility]URO ORS;  Service: Neurosurgery;  Laterality: Right;  Thoracic four-five and thoracic nine-ten laminotomy with transpedicular microdiskectomy    Family History  Problem Relation Age of Onset  . Crohn's disease Daughter   . Cancer Neg Hx   . Diabetes Neg Hx   .  Heart disease Neg Hx     Social History Social History  Substance Use Topics  . Smoking status: Former Smoker    Packs/day: 0.50    Years: 10.00    Types: Cigarettes    Quit date: 06/05/2017  . Smokeless tobacco: Never Used  . Alcohol use No    Allergies  Allergen Reactions  . Ibuprofen Other (See Comments)  . Latex Itching and Rash  . Penicillins Itching and Rash    Current Outpatient Prescriptions  Medication Sig Dispense Refill  . amLODipine-benazepril (LOTREL) 5-20 MG per capsule Take 1 capsule by mouth at bedtime.    . cholecalciferol (VITAMIN D) 1000 units tablet Take 1,000 Units by mouth daily.    . hydrochlorothiazide (HYDRODIURIL) 25 MG tablet Take 25 mg by mouth daily.    . Naproxen Sodium (ALEVE PO) Take 1 tablet by mouth as needed.    Marland Kitchen omeprazole (PRILOSEC) 20 MG capsule Take 20 mg by mouth daily.    . simvastatin (ZOCOR) 20 MG tablet Take 20 mg by mouth at bedtime.     No current facility-administered medications for this visit.     Review of Systems Review of Systems  Constitutional: Negative.   Respiratory: Negative.   Cardiovascular: Negative.     Blood pressure (!) 142/78, pulse 72, resp. rate 12, height 5\' 2"  (1.575  m), weight 202 lb (91.6 kg).  Physical Exam Physical Exam  Constitutional: She is oriented to person, place, and time. She appears well-developed and well-nourished.  Cardiovascular: Intact distal pulses.   Pulses:      Dorsalis pedis pulses are 2+ on the right side, and 2+ on the left side.       Posterior tibial pulses are 2+ on the right side, and 2+ on the left side.  Varicose veins noted on both legs. Scant edema The posterior right leg just below the midportion there is a irregular 3 x 2 cm area of induration and tenderness. A quick look with the ultrasound shows that these are superficial varicose vein that had clots in them. The vein continuation above and below this area appears normal  Neurological: She is alert and oriented  to person, place, and time.  Skin: Skin is warm and dry.  Psychiatric: Her behavior is normal.    Data Reviewed  Prior notes  Assessment    superficial phlebitis, focal, in area of varicosity in the posterior right leg    Plan   Patient advised on the finding in the nature of the problem Continue compression hose. Recommend Aleve or advil for 5-7 days. The patient is aware to use a heating pad as needed for comfort. She was advised to seek medical attention if symptoms worsen  Follow up as needed.      HPI, Physical Exam, Assessment and Plan have been scribed under the direction and in the presence of Mckinley Jewel, MD Karie Fetch, RN  I have completed the exam and reviewed the above documentation for accuracy and completeness.  I agree with the above.  Haematologist has been used and any errors in dictation or transcription are unintentional.  Seeplaputhur G. Jamal Collin, M.D., F.A.C.S.  Karie Fetch M 08/06/2017, 2:47 PM

## 2018-01-21 NOTE — Progress Notes (Signed)
ANNUAL PREVENTATIVE CARE GYN  ENCOUNTER NOTE  Subjective:       Sara Rhodes is a 62 y.o. G49P3003 female here for a routine annual gynecologic exam.    Current complaints: None.  Patient has been on the keto diet and has lost 22 pounds.  She also quit smoking in August 2018.  She has had no cravings and is very pleased with results.  She would like to lose another 20 pounds.  Bowel function is normal.  Bladder function is normal.  1. Annual Exam for 62 yo female  2. Menopause    Gynecologic History No LMP recorded. Patient has had a hysterectomy.  Status post [place] by Dr. Elisabeth Pigeon Contraception: hysterectomy Last Pap: 01/21/2017 neg/neg. Results were: normal Last mammogram: 03/31/2017 birad 2- [facility]   Patient in menopausal state, mildly symptomatic, does not desire HRT.  Vasomotor symptoms are still present but mild.   Obstetric History OB History  Gravida Para Term Preterm AB Living  3 3 3  0 0 3  SAB TAB Ectopic Multiple Live Births  0 0 0 0 3    # Outcome Date GA Lbr Len/2nd Weight Sex Delivery Anes PTL Lv  3 Term    8 lb (3.629 kg) F CS-LTranv   LIV  2 Term    8 lb (3.629 kg) F CS-LTranv   LIV  1 Term    8 lb 9.6 oz (3.901 kg) M Vag-Spont   LIV    Obstetric Comments  1st Menstrual Cycle: 12  1st Pregnancy: 25    Past Medical History:  Diagnosis Date  . Acid reflux   . Behcet's disease ([place])   . Family history of anesthesia complication    N/V daughter  . Fatty liver   . Hyperlipemia   . Hypertension   . Obesity   . Schatzki's ring   . Tobacco use   . Vaginal atrophy   . Vulvar itching     Past Surgical History:  Procedure Laterality Date  . ABDOMINAL HYSTERECTOMY     total- lsh bso- fibroids  . BACK SURGERY    . CESAREAN SECTION    . COLONOSCOPY WITH PROPOFOL N/A 12/31/2015   Procedure: COLONOSCOPY WITH PROPOFOL;  Surgeon: Manya Silvas, MD;  Location: [facility] ENDOSCOPY;  Service: Endoscopy;  Laterality: N/A;  . ESOPHAGOGASTRODUODENOSCOPY (EGD)  WITH PROPOFOL N/A 12/31/2015   Procedure: ESOPHAGOGASTRODUODENOSCOPY (EGD) WITH PROPOFOL;  Surgeon: Manya Silvas, MD;  Location[facility] ENDOSCOPY;  Service: Endoscopy;  Laterality: N/A;  . THORACIC DISCECTOMY Right 03/14/2013   Procedure: THORACIC DISCECTOMY;  Surgeon: Charlie Pitter, MD;  Location: [facility] NEURO ORS;  Service: Neurosurgery;  Laterality: Right;  Thoracic four-five and thoracic nine-ten laminotomy with transpedicular microdiskectomy    Current Outpatient Medications on File Prior to Visit  Medication Sig Dispense Refill  . amLODipine-benazepril (LOTREL) 5-20 MG per capsule Take 1 capsule by mouth at bedtime.    . cholecalciferol (VITAMIN D) 1000 units tablet Take 1,000 Units by mouth daily.    . hydrochlorothiazide (HYDRODIURIL) 25 MG tablet Take 25 mg by mouth daily.    . Naproxen Sodium (ALEVE PO) Take 1 tablet by mouth as needed.    Marland Kitchen omeprazole (PRILOSEC) 20 MG capsule Take 20 mg by mouth daily.    . simvastatin (ZOCOR) 20 MG tablet Take 20 mg by mouth at bedtime.     No current facility-administered medications on file prior to visit.     Allergies  Allergen Reactions  . Ibuprofen Other (  See Comments)  . Latex Itching and Rash  . Penicillins Itching and Rash    Social History   Socioeconomic History  . Marital status: Married    Spouse name: Not on file  . Number of children: Not on file  . Years of education: Not on file  . Highest education level: Not on file  Occupational History  . Not on file  Social Needs  . Financial resource strain: Not on file  . Food insecurity:    Worry: Not on file    Inability: Not on file  . Transportation needs:    Medical: Not on file    Non-medical: Not on file  Tobacco Use  . Smoking status: Former Smoker    Packs/day: 0.50    Years: 10.00    Pack years: 5.00    Types: Cigarettes    Last attempt to quit: 06/05/2017    Years since quitting: 0.6  . Smokeless tobacco: Never Used  Substance and Sexual Activity  . Alcohol  use: No  . Drug use: No  . Sexual activity: Yes    Birth control/protection: Surgical  Lifestyle  . Physical activity:    Days per week: Not on file    Minutes per session: Not on file  . Stress: Not on file  Relationships  . Social connections:    Talks on phone: Not on file    Gets together: Not on file    Attends religious service: Not on file    Active member of club or organization: Not on file    Attends meetings of clubs or organizations: Not on file    Relationship status: Not on file  . Intimate partner violence:    Fear of current or ex partner: Not on file    Emotionally abused: Not on file    Physically abused: Not on file    Forced sexual activity: Not on file  Other Topics Concern  . Not on file  Social History Narrative  . Not on file    Family History  Problem Relation Age of Onset  . Crohn's disease Daughter   . Cancer Neg Hx   . Diabetes Neg Hx   . Heart disease Neg Hx     The following portions of the patient's history were reviewed and updated as appropriate: allergies, current medications, past family history, past medical history, past social history, past surgical history and problem list.  Review of Systems  Review of Systems  Constitutional: Negative.        Mild hot flashes and night sweats, not desiring medication  HENT: Negative.   Eyes: Negative.   Respiratory: Negative.   Gastrointestinal: Negative.   Genitourinary: Negative.   Musculoskeletal: Negative.   Skin: Negative.   Neurological: Negative.   Endo/Heme/Allergies: Negative.   Psychiatric/Behavioral: Negative.     Objective:   BP 122/77   Pulse 65   Ht 5\' 2"  (1.575 m)   Wt 202 lb 14.4 oz (92 kg)   BMI 37.11 kg/m  CONSTITUTIONAL: Well-developed, well-nourished female in no acute distress.  PSYCHIATRIC: Normal mood and affect. Normal behavior. Normal judgment and thought content. [place]: Alert and oriented to person, place, and time. Normal muscle tone coordination.  No cranial nerve deficit noted. HENT:  Normocephalic, atraumatic, External right and left ear normal. Oropharynx is clear and moist EYES: Conjunctivae and EOM are normal.  No scleral icterus.  NECK: Normal range of motion, supple, no masses.  Normal thyroid.  SKIN: Skin is warm  and dry. No rash noted. Not diaphoretic. No erythema. No pallor. CARDIOVASCULAR: Normal heart rate noted, regular rhythm, no murmur. RESPIRATORY: Clear to auscultation bilaterally. Effort and breath sounds normal, no problems with respiration noted. BREASTS: Symmetric in size. No masses, skin changes, nipple drainage, or lymphadenopathy. ABDOMEN: Soft, normal bowel sounds, no distention noted.  No tenderness, rebound or guarding. RLQ palpation did not elicit any pain or reaction from patient. Midline incision, well-healed, no hernia, no evidence of rash or irritation under pannus.  BLADDER: Normal PELVIC:  External Genitalia: Normal  BUS: Normal  Vagina: Mild atrophy, mild reduction in rugae along vaginal wall; no significant cystocele or rectocele.  Snug  Cervix: Normal, no cervical motion tenderness; no prolapse-cervix situated high in the vaginal vault  Uterus: Surgically absent   Adnexa: Normal, non-palpable, no tenderness, b/l ovaries surgically absent   RV: External Exam NormaI, internal rectal exam no evidence of rectal masses, normal sphincter tone  MUSCULOSKELETAL: Normal range of motion. No tenderness.  No cyanosis, clubbing, or edema.  2+ distal pulses. LYMPHATIC: No Axillary, Supraclavicular, or Inguinal Adenopathy.   Assessment:   1. Annual gynecologic examination 62 y.o. 2. Contraception: status post hysterectomy status post LSH 3. Obese, BMI 37; significant weight loss-22 pounds through keto diet  4. Menopause, minimally symptomatic   Plan:  1. Pap Smear Performed:Due 2021 2. Mammogram: Ordered 3. Stool Guaiac Testing: ordered 4. Routine preventative health maintenance measures emphasized:    Exercise/Diet/Weight control and Tobacco Warnings.     Patient recommended to continue daily exercise and dietary restriction; continued weight loss encouraged. 5. Continue vitamin D supplementation, add Tums for increased absorption.  6. Return to [place], [place]   Brayton Mars, MD   Note: This dictation was prepared with Dragon dictation along with smaller phrase technology. Any transcriptional errors that result from this process are unintentional.

## 2018-01-26 ENCOUNTER — Ambulatory Visit (INDEPENDENT_AMBULATORY_CARE_PROVIDER_SITE_OTHER): Payer: Commercial Managed Care - PPO | Admitting: Obstetrics and Gynecology

## 2018-01-26 ENCOUNTER — Encounter: Payer: Self-pay | Admitting: Obstetrics and Gynecology

## 2018-01-26 VITALS — BP 122/77 | HR 65 | Ht 62.0 in | Wt 202.9 lb

## 2018-01-26 DIAGNOSIS — R638 Other symptoms and signs concerning food and fluid intake: Secondary | ICD-10-CM

## 2018-01-26 DIAGNOSIS — Z01419 Encounter for gynecological examination (general) (routine) without abnormal findings: Secondary | ICD-10-CM

## 2018-01-26 DIAGNOSIS — Z1239 Encounter for other screening for malignant neoplasm of breast: Secondary | ICD-10-CM

## 2018-01-26 DIAGNOSIS — Z780 Asymptomatic menopausal state: Secondary | ICD-10-CM | POA: Diagnosis not present

## 2018-01-26 DIAGNOSIS — Z1211 Encounter for screening for malignant neoplasm of colon: Secondary | ICD-10-CM | POA: Diagnosis not present

## 2018-01-26 DIAGNOSIS — Z90721 Acquired absence of ovaries, unilateral: Secondary | ICD-10-CM

## 2018-01-26 DIAGNOSIS — Z90710 Acquired absence of both cervix and uterus: Secondary | ICD-10-CM | POA: Diagnosis not present

## 2018-01-26 DIAGNOSIS — I10 Essential (primary) hypertension: Secondary | ICD-10-CM

## 2018-01-26 DIAGNOSIS — Z1231 Encounter for screening mammogram for malignant neoplasm of breast: Secondary | ICD-10-CM

## 2018-01-26 NOTE — Patient Instructions (Addendum)
1.  No Pap smear is done.  Next Pap is due in 2021. 2.  Mammogram is ordered 3.  Stool guaiac cards testing is given for colon cancer screening 4.  Continue with healthy eating, exercise, and controlled weight loss 5.  Continue with vitamin D supplementation twice daily-sources of calcium include Tums (6 a day), Citracal, Os-Cal, Viactiv chews 6.  Return in 1 year for annual exam   Health Maintenance for Postmenopausal Women Menopause is a normal process in which your reproductive ability comes to an end. This process happens gradually over a span of months to years, usually between the ages of 23 and 82. Menopause is complete when you have missed 12 consecutive menstrual periods. It is important to talk with your health care provider about some of the most common conditions that affect postmenopausal women, such as heart disease, cancer, and bone loss (osteoporosis). Adopting a healthy lifestyle and getting preventive care can help to promote your health and wellness. Those actions can also lower your chances of developing some of these common conditions. What should I know about menopause? During menopause, you may experience a number of symptoms, such as:  Moderate-to-severe hot flashes.  Night sweats.  Decrease in sex drive.  Mood swings.  Headaches.  Tiredness.  Irritability.  Memory problems.  Insomnia.  Choosing to treat or not to treat menopausal changes is an individual decision that you make with your health care provider. What should I know about hormone replacement therapy and supplements? Hormone therapy products are effective for treating symptoms that are associated with menopause, such as hot flashes and night sweats. Hormone replacement carries certain risks, especially as you become older. If you are thinking about using estrogen or estrogen with progestin treatments, discuss the benefits and risks with your health care provider. What should I know about heart  disease and stroke? Heart disease, heart attack, and stroke become more likely as you age. This may be due, in part, to the hormonal changes that your body experiences during menopause. These can affect how your body processes dietary fats, triglycerides, and cholesterol. Heart attack and stroke are both medical emergencies. There are many things that you can do to help prevent heart disease and stroke:  Have your blood pressure checked at least every 1-2 years. High blood pressure causes heart disease and increases the risk of stroke.  If you are 39-42 years old, ask your health care provider if you should take aspirin to prevent a heart attack or a stroke.  Do not use any tobacco products, including cigarettes, chewing tobacco, or electronic cigarettes. If you need help quitting, ask your health care provider.  It is important to eat a healthy diet and maintain a healthy weight. ? Be sure to include plenty of vegetables, fruits, low-fat dairy products, and lean protein. ? Avoid eating foods that are high in solid fats, added sugars, or salt (sodium).  Get regular exercise. This is one of the most important things that you can do for your health. ? Try to exercise for at least 150 minutes each week. The type of exercise that you do should increase your heart rate and make you sweat. This is known as moderate-intensity exercise. ? Try to do strengthening exercises at least twice each week. Do these in addition to the moderate-intensity exercise.  Know your numbers.Ask your health care provider to check your cholesterol and your blood glucose. Continue to have your blood tested as directed by your health care provider.  What  should I know about cancer screening? There are several types of cancer. Take the following steps to reduce your risk and to catch any cancer development as early as possible. Breast Cancer  Practice breast self-awareness. ? This means understanding how your breasts  normally appear and feel. ? It also means doing regular breast self-exams. Let your health care provider know about any changes, no matter how small.  If you are 22 or older, have a clinician do a breast exam (clinical breast exam or CBE) every year. Depending on your age, family history, and medical history, it may be recommended that you also have a yearly breast X-ray (mammogram).  If you have a family history of breast cancer, talk with your health care provider about genetic screening.  If you are at high risk for breast cancer, talk with your health care provider about having an MRI and a mammogram every year.  Breast cancer (BRCA) gene test is recommended for women who have family members with BRCA-related cancers. Results of the assessment will determine the need for genetic counseling and BRCA1 and for BRCA2 testing. BRCA-related cancers include these types: ? Breast. This occurs in males or females. ? Ovarian. ? Tubal. This may also be called fallopian tube cancer. ? Cancer of the abdominal or pelvic lining (peritoneal cancer). ? Prostate. ? Pancreatic.  Cervical, Uterine, and Ovarian Cancer Your health care provider may recommend that you be screened regularly for cancer of the pelvic organs. These include your ovaries, uterus, and vagina. This screening involves a pelvic exam, which includes checking for microscopic changes to the surface of your cervix (Pap test).  For women ages 21-65, health care providers may recommend a pelvic exam and a Pap test every three years. For women ages 47-65, they may recommend the Pap test and pelvic exam, combined with testing for human papilloma virus (HPV), every five years. Some types of HPV increase your risk of cervical cancer. Testing for HPV may also be done on women of any age who have unclear Pap test results.  Other health care providers may not recommend any screening for nonpregnant women who are considered low risk for pelvic cancer  and have no symptoms. Ask your health care provider if a screening pelvic exam is right for you.  If you have had past treatment for cervical cancer or a condition that could lead to cancer, you need Pap tests and screening for cancer for at least 20 years after your treatment. If Pap tests have been discontinued for you, your risk factors (such as having a new sexual partner) need to be reassessed to determine if you should start having screenings again. Some women have medical problems that increase the chance of getting cervical cancer. In these cases, your health care provider may recommend that you have screening and Pap tests more often.  If you have a family history of uterine cancer or ovarian cancer, talk with your health care provider about genetic screening.  If you have vaginal bleeding after reaching menopause, tell your health care provider.  There are currently no reliable tests available to screen for ovarian cancer.  Lung Cancer Lung cancer screening is recommended for adults 21-23 years old who are at high risk for lung cancer because of a history of smoking. A yearly low-dose CT scan of the lungs is recommended if you:  Currently smoke.  Have a history of at least 30 pack-years of smoking and you currently smoke or have quit within the past 15  years. A pack-year is smoking an average of one pack of cigarettes per day for one year.  Yearly screening should:  Continue until it has been 15 years since you quit.  Stop if you develop a health problem that would prevent you from having lung cancer treatment.  Colorectal Cancer  This type of cancer can be detected and can often be prevented.  Routine colorectal cancer screening usually begins at age 76 and continues through age 69.  If you have risk factors for colon cancer, your health care provider may recommend that you be screened at an earlier age.  If you have a family history of colorectal cancer, talk with your  health care provider about genetic screening.  Your health care provider may also recommend using home test kits to check for hidden blood in your stool.  A small camera at the end of a tube can be used to examine your colon directly (sigmoidoscopy or colonoscopy). This is done to check for the earliest forms of colorectal cancer.  Direct examination of the colon should be repeated every 5-10 years until age 2. However, if early forms of precancerous polyps or small growths are found or if you have a family history or genetic risk for colorectal cancer, you may need to be screened more often.  Skin Cancer  Check your skin from head to toe regularly.  Monitor any moles. Be sure to tell your health care provider: ? About any new moles or changes in moles, especially if there is a change in a mole's shape or color. ? If you have a mole that is larger than the size of a pencil eraser.  If any of your family members has a history of skin cancer, especially at a young age, talk with your health care provider about genetic screening.  Always use sunscreen. Apply sunscreen liberally and repeatedly throughout the day.  Whenever you are outside, protect yourself by wearing long sleeves, pants, a wide-brimmed hat, and sunglasses.  What should I know about osteoporosis? Osteoporosis is a condition in which bone destruction happens more quickly than new bone creation. After menopause, you may be at an increased risk for osteoporosis. To help prevent osteoporosis or the bone fractures that can happen because of osteoporosis, the following is recommended:  If you are 65-63 years old, get at least 1,000 mg of calcium and at least 600 mg of vitamin D per day.  If you are older than age 23 but younger than age 43, get at least 1,200 mg of calcium and at least 600 mg of vitamin D per day.  If you are older than age 16, get at least 1,200 mg of calcium and at least 800 mg of vitamin D per day.  Smoking  and excessive alcohol intake increase the risk of osteoporosis. Eat foods that are rich in calcium and vitamin D, and do weight-bearing exercises several times each week as directed by your health care provider. What should I know about how menopause affects my mental health? Depression may occur at any age, but it is more common as you become older. Common symptoms of depression include:  Low or sad mood.  Changes in sleep patterns.  Changes in appetite or eating patterns.  Feeling an overall lack of motivation or enjoyment of activities that you previously enjoyed.  Frequent crying spells.  Talk with your health care provider if you think that you are experiencing depression. What should I know about immunizations? It is important that  you get and maintain your immunizations. These include:  Tetanus, diphtheria, and pertussis (Tdap) booster vaccine.  Influenza every year before the flu season begins.  Pneumonia vaccine.  Shingles vaccine.  Your health care provider may also recommend other immunizations. This information is not intended to replace advice given to you by your health care provider. Make sure you discuss any questions you have with your health care provider. Document Released: 12/12/2005 Document Revised: 05/09/2016 Document Reviewed: 07/24/2015 Elsevier Interactive Patient Education  2018 [employer].

## 2018-04-01 ENCOUNTER — Encounter: Payer: Self-pay | Admitting: Obstetrics and Gynecology

## 2018-06-24 DIAGNOSIS — R7303 Prediabetes: Secondary | ICD-10-CM | POA: Insufficient documentation

## 2018-10-07 ENCOUNTER — Encounter: Payer: Self-pay | Admitting: Obstetrics and Gynecology

## 2018-10-07 ENCOUNTER — Telehealth: Payer: Self-pay | Admitting: Obstetrics and Gynecology

## 2018-10-07 ENCOUNTER — Ambulatory Visit (INDEPENDENT_AMBULATORY_CARE_PROVIDER_SITE_OTHER): Payer: PRIVATE HEALTH INSURANCE | Admitting: Obstetrics and Gynecology

## 2018-10-07 ENCOUNTER — Encounter: Payer: Self-pay | Admitting: Surgical

## 2018-10-07 VITALS — BP 111/67 | HR 66 | Ht 63.0 in | Wt 201.7 lb

## 2018-10-07 DIAGNOSIS — R1030 Lower abdominal pain, unspecified: Secondary | ICD-10-CM | POA: Diagnosis not present

## 2018-10-07 LAB — POCT URINALYSIS DIPSTICK
BILIRUBIN UA: NEGATIVE
Glucose, UA: NEGATIVE
Ketones, UA: NEGATIVE
Nitrite, UA: NEGATIVE
PROTEIN UA: NEGATIVE
RBC UA: NEGATIVE
Spec Grav, UA: 1.01 (ref 1.010–1.025)
Urobilinogen, UA: 0.2 E.U./dL
pH, UA: 6 (ref 5.0–8.0)

## 2018-10-07 MED ORDER — NITROFURANTOIN MONOHYD MACRO 100 MG PO CAPS
100.0000 mg | ORAL_CAPSULE | Freq: Two times a day (BID) | ORAL | 0 refills | Status: DC
Start: 2018-10-07 — End: 2019-12-28

## 2018-10-07 NOTE — Progress Notes (Signed)
Chief complaint: 1.  Lower abdominal pain  Sara Rhodes presents today for evaluation of lower abdominal pain over the past several days.  She reports suprapubic discomfort.  No fever chills or sweats.  No nausea or vomiting.  No flank pain.  She is not experiencing significant urinary frequency or urgency or dysuria.  Urine volume is diminished today.  Past Medical History:  Diagnosis Date  . Acid reflux   . Behcet's disease ([place])   . Family history of anesthesia complication    N/V daughter  . Fatty liver   . Hyperlipemia   . Hypertension   . Obesity   . Schatzki's ring   . Tobacco use   . Vaginal atrophy   . Vulvar itching    Past Surgical History:  Procedure Laterality Date  . ABDOMINAL HYSTERECTOMY     total- lsh bso- fibroids  . BACK SURGERY    . CESAREAN SECTION    . COLONOSCOPY WITH PROPOFOL N/A 12/31/2015   Procedure: COLONOSCOPY WITH PROPOFOL;  Surgeon: Manya Silvas, MD;  Location: [facility] ENDOSCOPY;  Service: Endoscopy;  Laterality: N/A;  . ESOPHAGOGASTRODUODENOSCOPY (EGD) WITH PROPOFOL N/A 12/31/2015   Procedure: ESOPHAGOGASTRODUODENOSCOPY (EGD) WITH PROPOFOL;  Surgeon: Manya Silvas, MD;  Location: [facility] ENDOSCOPY;  Service: Endoscopy;  Laterality: N/A;  . THORACIC DISCECTOMY Right 03/14/2013   Procedure: THORACIC DISCECTOMY;  Surgeon: Charlie Pitter, MD;  Location: [place] NEURO ORS;  Service: Neurosurgery;  Laterality: Right;  Thoracic four-five and thoracic nine-ten laminotomy with transpedicular microdiskectomy   Review of Systems  Constitutional: Positive for malaise/fatigue. Negative for chills and fever.  Respiratory: Negative for cough and shortness of breath.   Cardiovascular: Negative for chest pain and palpitations.  Gastrointestinal: Positive for abdominal pain. Negative for diarrhea, nausea and vomiting.  Genitourinary: Negative for dysuria, flank pain, frequency, hematuria and urgency.  Musculoskeletal: Negative.   Skin: Negative for rash.  All other systems  reviewed and are negative.  OBJECTIVE: BP 111/67   Pulse 66   Ht 5\' 3"  (1.6 m)   Wt 201 lb 11.2 oz (91.5 kg)   BMI 35.73 kg/m  Pleasant female no acute distress.  Alert and oriented. Neck: Supple without thyromegaly or adenopathy Lungs: Clear Heart: Regular rate and rhythm without murmur Abdomen: Soft, without peritoneal signs; no hepatosplenomegaly; mild tenderness in the suprapubic region Bladder: Tender Pelvic: External genitalia-normal BUS-normal Vagina-atrophic changes Bimanual-tender anterior vaginal wall (suburethral) Extremities: Warm and dry  Urinalysis: Moderate leukocytes; negative nitrites; negative blood  ASSESSMENT: 1.  Suspect UTI 2.  Moderate white blood cells in urine  PLAN: 1.  Increase water intake and cranberry juice intake 2.  Urine culture is sent 3.  Begin Macrobid 1 pill twice a day for 7 days 4.  Results from culture will be made available through my chart as well as further management planning if necessary.  Brayton Mars, MD  Note: This dictation was prepared with Dragon dictation along with smaller phrase technology. Any transcriptional errors that result from this process are unintentional.

## 2018-10-07 NOTE — Telephone Encounter (Signed)
The patient called and stated that she would like to come in today for a possible UTI. The patient is requesting a call back this morning. Please advise.

## 2018-10-07 NOTE — Telephone Encounter (Signed)
Patient is having spasms and pelvic pain that started on Tuesday. She is coming in for visit today.

## 2018-10-07 NOTE — Patient Instructions (Signed)
1.  Begin Macrobid 1 pill twice a day for suspected urinary tract infection 2.  Increase water intake and cranberry juice intake 3.  Urinalysis is abnormal with blunt white blood cells in the urine.  Urine culture is sent.  Results will be available through my chart within 72 hours. 4.  Follow-up as needed for other gynecologic issues with Dr. Marcelline Mates or Dr. Amalia Hailey.   Urinary Tract Infection, Adult A urinary tract infection (UTI) is an infection of any part of the urinary tract, which includes the kidneys, ureters, bladder, and urethra. These organs make, store, and get rid of urine in the body. UTI can be a bladder infection (cystitis) or kidney infection (pyelonephritis). What are the causes? This infection may be caused by fungi, viruses, or bacteria. Bacteria are the most common cause of UTIs. This condition can also be caused by repeated incomplete emptying of the bladder during urination. What increases the risk? This condition is more likely to develop if:  You ignore your need to urinate or hold urine for long periods of time.  You do not empty your bladder completely during urination.  You wipe back to front after urinating or having a bowel movement, if you are female.  You are uncircumcised, if you are female.  You are constipated.  You have a urinary catheter that stays in place (indwelling).  You have a weak defense (immune) system.  You have a medical condition that affects your bowels, kidneys, or bladder.  You have diabetes.  You take antibiotic medicines frequently or for long periods of time, and the antibiotics no longer work well against certain types of infections (antibiotic resistance).  You take medicines that irritate your urinary tract.  You are exposed to chemicals that irritate your urinary tract.  You are female.  What are the signs or symptoms? Symptoms of this condition include:  Fever.  Frequent urination or passing small amounts of urine  frequently.  Needing to urinate urgently.  Pain or burning with urination.  Urine that smells bad or unusual.  Cloudy urine.  Pain in the lower abdomen or back.  Trouble urinating.  Blood in the urine.  Vomiting or being less hungry than normal.  Diarrhea or abdominal pain.  Vaginal discharge, if you are female.  How is this diagnosed? This condition is diagnosed with a medical history and physical exam. You will also need to provide a urine sample to test your urine. Other tests may be done, including:  Blood tests.  Sexually transmitted disease (STD) testing.  If you have had more than one UTI, a cystoscopy or imaging studies may be done to determine the cause of the infections. How is this treated? Treatment for this condition often includes a combination of two or more of the following:  Antibiotic medicine.  Other medicines to treat less common causes of UTI.  Over-the-counter medicines to treat pain.  Drinking enough water to stay hydrated.  Follow these instructions at home:  Take over-the-counter and prescription medicines only as told by your health care provider.  If you were prescribed an antibiotic, take it as told by your health care provider. Do not stop taking the antibiotic even if you start to feel better.  Avoid alcohol, caffeine, tea, and carbonated beverages. They can irritate your bladder.  Drink enough fluid to keep your urine clear or pale yellow.  Keep all follow-up visits as told by your health care provider. This is important.  Make sure to: ? Empty your bladder  often and completely. Do not hold urine for long periods of time. ? Empty your bladder before and after sex. ? Wipe from front to back after a bowel movement if you are female. Use each tissue one time when you wipe. Contact a health care provider if:  You have back pain.  You have a fever.  You feel nauseous or vomit.  Your symptoms do not get better after 3  days.  Your symptoms go away and then return. Get help right away if:  You have severe back pain or lower abdominal pain.  You are vomiting and cannot keep down any medicines or water. This information is not intended to replace advice given to you by your health care provider. Make sure you discuss any questions you have with your health care provider. Document Released: 07/30/2005 Document Revised: 04/02/2016 Document Reviewed: 09/10/2015 Elsevier Interactive Patient Education  [employer].

## 2018-10-09 LAB — URINE CULTURE: Organism ID, Bacteria: NO GROWTH

## 2019-09-23 ENCOUNTER — Other Ambulatory Visit: Payer: Self-pay | Admitting: Neurosurgery

## 2019-09-23 DIAGNOSIS — M5104 Intervertebral disc disorders with myelopathy, thoracic region: Secondary | ICD-10-CM

## 2019-09-23 DIAGNOSIS — M5412 Radiculopathy, cervical region: Secondary | ICD-10-CM

## 2019-10-09 ENCOUNTER — Ambulatory Visit
Admission: RE | Admit: 2019-10-09 | Discharge: 2019-10-09 | Disposition: A | Discharge status: Home / Self Care | Payer: Commercial Managed Care - PPO | Source: Ambulatory Visit | Attending: Neurosurgery | Admitting: Neurosurgery

## 2019-10-09 ENCOUNTER — Other Ambulatory Visit: Payer: Self-pay

## 2019-10-09 DIAGNOSIS — M5104 Intervertebral disc disorders with myelopathy, thoracic region: Secondary | ICD-10-CM

## 2019-10-09 DIAGNOSIS — M5412 Radiculopathy, cervical region: Secondary | ICD-10-CM

## 2019-12-28 ENCOUNTER — Encounter: Payer: Self-pay | Admitting: Emergency Medicine

## 2019-12-28 ENCOUNTER — Ambulatory Visit
Admission: EM | Admit: 2019-12-28 | Discharge: 2019-12-28 | Disposition: A | Discharge status: Home / Self Care | Payer: Commercial Managed Care - PPO | Attending: Family Medicine | Admitting: Family Medicine

## 2019-12-28 ENCOUNTER — Other Ambulatory Visit: Payer: Self-pay

## 2019-12-28 DIAGNOSIS — Z20822 Contact with and (suspected) exposure to covid-19: Secondary | ICD-10-CM | POA: Diagnosis not present

## 2019-12-28 DIAGNOSIS — Z8249 Family history of ischemic heart disease and other diseases of the circulatory system: Secondary | ICD-10-CM | POA: Insufficient documentation

## 2019-12-28 DIAGNOSIS — J029 Acute pharyngitis, unspecified: Secondary | ICD-10-CM | POA: Diagnosis present

## 2019-12-28 DIAGNOSIS — Z87891 Personal history of nicotine dependence: Secondary | ICD-10-CM | POA: Diagnosis not present

## 2019-12-28 DIAGNOSIS — Z880 Allergy status to penicillin: Secondary | ICD-10-CM | POA: Insufficient documentation

## 2019-12-28 DIAGNOSIS — I10 Essential (primary) hypertension: Secondary | ICD-10-CM | POA: Insufficient documentation

## 2019-12-28 DIAGNOSIS — E7800 Pure hypercholesterolemia, unspecified: Secondary | ICD-10-CM | POA: Insufficient documentation

## 2019-12-28 DIAGNOSIS — J988 Other specified respiratory disorders: Secondary | ICD-10-CM | POA: Diagnosis not present

## 2019-12-28 DIAGNOSIS — Z6838 Body mass index (BMI) 38.0-38.9, adult: Secondary | ICD-10-CM | POA: Insufficient documentation

## 2019-12-28 DIAGNOSIS — Z79899 Other long term (current) drug therapy: Secondary | ICD-10-CM | POA: Insufficient documentation

## 2019-12-28 DIAGNOSIS — Z841 Family history of disorders of kidney and ureter: Secondary | ICD-10-CM | POA: Insufficient documentation

## 2019-12-28 DIAGNOSIS — E669 Obesity, unspecified: Secondary | ICD-10-CM | POA: Diagnosis not present

## 2019-12-28 DIAGNOSIS — Z8379 Family history of other diseases of the digestive system: Secondary | ICD-10-CM | POA: Insufficient documentation

## 2019-12-28 DIAGNOSIS — B9789 Other viral agents as the cause of diseases classified elsewhere: Secondary | ICD-10-CM | POA: Insufficient documentation

## 2019-12-28 DIAGNOSIS — Z91040 Latex allergy status: Secondary | ICD-10-CM | POA: Diagnosis not present

## 2019-12-28 DIAGNOSIS — Z886 Allergy status to analgesic agent status: Secondary | ICD-10-CM | POA: Diagnosis not present

## 2019-12-28 MED ORDER — BENZONATATE 200 MG PO CAPS: 200.0000 mg | ORAL_CAPSULE | Freq: Three times a day (TID) | ORAL | 0 refills | Status: DC | PRN

## 2019-12-28 NOTE — Discharge Instructions (Signed)
COVID results available in 24 to 48 hours.  Medication as prescribed.  Stay home.  Take care  Dr. Lacinda Axon

## 2019-12-28 NOTE — ED Triage Notes (Signed)
Patient now stating that all symptoms have resolved except for the ear fullness.

## 2019-12-28 NOTE — ED Provider Notes (Signed)
[facility]    CSN: [id number] Arrival date & time: 12/28/19  0855  History   Chief Complaint Chief Complaint  Patient presents with  . Sore Throat  . Nasal Congestion   HPI  64 year old female presents with the above complaints.  Patient reports that over the past 3 days she has had sore throat, congestion, cough, ear fullness.  She has had Covid vaccine recently on 2/12.  She states that she is feeling better but still has some ear fullness and mild cough.  She states that her voice is also not back to normal.  She states that her work is requiring her to have a Covid test before she returns.  Additionally, she states that she has had contacts with her grandchildren who have had similar symptoms.  She believes that she has a cold.  She has been taking over-the-counter medication.  No exacerbating factors.  No other complaints.  Past Medical History:  Diagnosis Date  . Acid reflux   . Behcet's disease ([place])   . Family history of anesthesia complication    N/V daughter  . Fatty liver   . Hyperlipemia   . Hypertension   . Obesity   . Schatzki's ring   . Tobacco use   . Vaginal atrophy   . Vulvar itching     Patient Active Problem List   Diagnosis Date Noted  . Status post hysterectomy with oophorectomy 01/21/2017  . Fatty liver 10/24/2015  . GERD (gastroesophageal reflux disease) 03/10/2014  . HTN (hypertension) 03/10/2014  . Obesity 03/10/2014  . Hypercholesterolemia 03/10/2014  . Varicose veins of lower extremities with other complications 0000000  . HNP (herniated nucleus pulposus), thoracic 03/14/2013    Past Surgical History:  Procedure Laterality Date  . ABDOMINAL HYSTERECTOMY     total- lsh bso- fibroids  . BACK SURGERY    . CESAREAN SECTION    . COLONOSCOPY WITH PROPOFOL N/A 12/31/2015   Procedure: COLONOSCOPY WITH PROPOFOL;  Surgeon: Manya Silvas, MD;  Location[facility] ENDOSCOPY;  Service: Endoscopy;  Laterality: N/A;  .  ESOPHAGOGASTRODUODENOSCOPY (EGD) WITH PROPOFOL N/A 12/31/2015   Procedure: ESOPHAGOGASTRODUODENOSCOPY (EGD) WITH PROPOFOL;  Surgeon: Manya Silvas, MD;  Location: [facility] ENDOSCOPY;  Service: Endoscopy;  Laterality: N/A;  . THORACIC DISCECTOMY Right 03/14/2013   Procedure: THORACIC DISCECTOMY;  Surgeon: Charlie Pitter, MD;  Location: [place] NEURO ORS;  Service: Neurosurgery;  Laterality: Right;  Thoracic four-five and thoracic nine-ten laminotomy with transpedicular microdiskectomy    OB History    Gravida  3   Para  3   Term  3   Preterm  0   AB  0   Living  3     SAB  0   TAB  0   Ectopic  0   Multiple  0   Live Births  3        Obstetric Comments  1st Menstrual Cycle: 12 1st Pregnancy: 25         Home Medications    Prior to Admission medications   Medication Sig Start Date End Date Taking? Authorizing Provider  amLODipine-benazepril (LOTREL) 5-20 MG per capsule Take 1 capsule by mouth at bedtime.   Yes [provider]  hydrochlorothiazide (HYDRODIURIL) 25 MG tablet Take 25 mg by mouth daily.   Yes [provider]  simvastatin (ZOCOR) 20 MG tablet Take 20 mg by mouth at bedtime.   Yes [provider]  benzonatate (TESSALON) 200 MG capsule Take 1 capsule (200 mg  total) by mouth 3 (three) times daily as needed for cough. 12/28/19   Coral Spikes, DO  omeprazole (PRILOSEC) 20 MG capsule Take 20 mg by mouth daily.  12/28/19  [provider]    Family History Family History  Problem Relation Age of Onset  . Crohn's disease Daughter   . Leukemia Mother   . Kidney failure Mother   . AAA (abdominal aortic aneurysm) Father   . Hypertension Father   . Cancer Neg Hx   . Diabetes Neg Hx   . Heart disease Neg Hx     Social History Social History   Tobacco Use  . Smoking status: Former Smoker    Packs/day: 0.50    Years: 10.00    Pack years: 5.00    Types: Cigarettes    Quit date: 06/05/2017    Years since quitting: 2.5  .  Smokeless tobacco: Never Used  Substance Use Topics  . Alcohol use: No  . Drug use: No     Allergies   Ibuprofen, Latex, and Penicillins   Review of Systems Review of Systems Per HPI  Physical Exam Triage Vital Signs ED Triage Vitals  Enc Vitals Group     BP 12/28/19 0918 134/77     Pulse Rate 12/28/19 0918 60     Resp 12/28/19 0918 18     Temp 12/28/19 0918 98.4 F (36.9 C)     Temp Source 12/28/19 0918 Oral     SpO2 12/28/19 0918 100 %     Weight 12/28/19 0920 210 lb (95.3 kg)     Height 12/28/19 0920 5\' 2"  (1.575 m)     Head Circumference --      Peak Flow --      Pain Score 12/28/19 0918 0     Pain Loc --      Pain Edu? --      Excl. in [place]? --    No data found.  Updated Vital Signs BP 134/77 (BP Location: Left Arm)   Pulse 60   Temp 98.4 F (36.9 C) (Oral)   Resp 18   Ht 5\' 2"  (1.575 m)   Wt 95.3 kg   SpO2 100%   BMI 38.41 kg/m   Visual Acuity Right Eye Distance:   Left Eye Distance:   Bilateral Distance:    Right Eye Near:   Left Eye Near:    Bilateral Near:     Physical Exam Vitals and nursing note reviewed.  Constitutional:      General: She is not in acute distress.    Appearance: Normal appearance. She is not ill-appearing.  HENT:     Head: Normocephalic and atraumatic.     Ears:     Comments: TMs with effusion, dullness. Eyes:     General:        Right eye: No discharge.        Left eye: No discharge.     Conjunctiva/sclera: Conjunctivae normal.  Cardiovascular:     Rate and Rhythm: Normal rate and regular rhythm.     Heart sounds: No murmur.  Pulmonary:     Effort: Pulmonary effort is normal.     Breath sounds: Normal breath sounds. No wheezing, rhonchi or rales.  Neurological:     Mental Status: She is alert.  Psychiatric:        Mood and Affect: Mood normal.        Behavior: Behavior normal.    UC Treatments / Results  Labs (all  labs ordered are listed, but only abnormal results are displayed) Labs Reviewed  NOVEL  CORONAVIRUS, NAA (HOSP ORDER, SEND-OUT TO REF LAB; TAT 18-24 HRS)    EKG   Radiology No results found.  Procedures Procedures (including critical care time)  Medications Ordered in UC Medications - No data to display  Initial Impression / Assessment and Plan / UC Course  I have reviewed the triage vital signs and the nursing notes.  Pertinent labs & imaging results that were available during my care of the patient were reviewed by me and considered in my medical decision making (see chart for details).    64 year old female presents with a viral respiratory illness.  Tessalon Perles as directed.  Awaiting Covid test results.  Work note given.  Final Clinical Impressions(s) / UC Diagnoses   Final diagnoses:  Viral respiratory infection     Discharge Instructions     COVID results available in 24 to 48 hours.  Medication as prescribed.  Stay home.  Take care  Dr. Lacinda Axon     ED Prescriptions    Medication Sig Dispense Auth. Provider   benzonatate (TESSALON) 200 MG capsule Take 1 capsule (200 mg total) by mouth 3 (three) times daily as needed for cough. 30 capsule Coral Spikes, DO     PDMP not reviewed this encounter.   Thersa Salt [place], [place] 12/28/19 [phone]

## 2019-12-28 NOTE — ED Triage Notes (Signed)
Patient in today with a 3 day history of sore throat, nasal congestion, slight cough and ear fullness. Patient received 1st dose of  Moderna Covid vaccine on 12/16/19.

## 2019-12-29 LAB — NOVEL CORONAVIRUS, NAA (HOSP ORDER, SEND-OUT TO REF LAB; TAT 18-24 HRS): SARS-CoV-2, NAA: NOT DETECTED

## 2020-03-26 ENCOUNTER — Other Ambulatory Visit: Payer: Self-pay

## 2020-03-26 ENCOUNTER — Ambulatory Visit
Admission: EM | Admit: 2020-03-26 | Discharge: 2020-03-26 | Disposition: A | Discharge status: Home / Self Care | Payer: Commercial Managed Care - PPO | Source: Home / Self Care | Attending: Family Medicine | Admitting: Family Medicine

## 2020-03-26 ENCOUNTER — Emergency Department: Payer: Commercial Managed Care - PPO

## 2020-03-26 ENCOUNTER — Encounter: Payer: Self-pay | Admitting: Emergency Medicine

## 2020-03-26 DIAGNOSIS — I10 Essential (primary) hypertension: Secondary | ICD-10-CM | POA: Insufficient documentation

## 2020-03-26 DIAGNOSIS — M79604 Pain in right leg: Secondary | ICD-10-CM | POA: Insufficient documentation

## 2020-03-26 DIAGNOSIS — Z79899 Other long term (current) drug therapy: Secondary | ICD-10-CM | POA: Insufficient documentation

## 2020-03-26 DIAGNOSIS — Z91040 Latex allergy status: Secondary | ICD-10-CM | POA: Insufficient documentation

## 2020-03-26 DIAGNOSIS — Z87891 Personal history of nicotine dependence: Secondary | ICD-10-CM | POA: Insufficient documentation

## 2020-03-26 NOTE — ED Triage Notes (Addendum)
Patient in today c/o right leg pain around her knee area x 2 weeks. Patient states she does have varicose veins. No injury noted. Patient has tried OTC Aleve and Tylenol without relief. Patient's vascular doctor has retired. Patient's PCP put in a referral today to [facility]. Patient waiting to hear from them.

## 2020-03-26 NOTE — ED Provider Notes (Signed)
[facility] ____________________________________________  Time seen: Approximately 6:25 PM  I have reviewed the triage vital signs and the nursing notes.   HISTORY  Chief Complaint Leg Pain (right)   HPI Sara Rhodes is a 64 y.o. female present for evaluation of right leg pain.  Patient reports she has recurrent issues with her varicose veins in bilateral lower extremities, particularly the right has had a history of superficial phlebitis.  Reports however in the last 2 weeks she has been having a more gradual onset of increased pain to the right medial posterior calf.  States right posterior thigh and distal lateral shin she has had longer-term issues with with her varicose veins but the medial area was different.  Has been using over-the-counter Tylenol, ibuprofen, rest to the area and applied warm compresses without resolution.  Denies fall, abrupt onset.  Patient states that she called her primary office and they put a referral order for vein and vascular and awaiting that appointment.  Denies immobilization.  Denies chest pain, shortness of breath, fevers or recent sickness.  Dion Body, MD : PCP   Past Medical History:  Diagnosis Date  . Acid reflux   . Behcet's disease ([place])   . Family history of anesthesia complication    N/V daughter  . Fatty liver   . Hyperlipemia   . Hypertension   . Obesity   . Schatzki's ring   . Tobacco use   . Vaginal atrophy   . Vulvar itching     Patient Active Problem List   Diagnosis Date Noted  . Status post hysterectomy with oophorectomy 01/21/2017  . Fatty liver 10/24/2015  . GERD (gastroesophageal reflux disease) 03/10/2014  . HTN (hypertension) 03/10/2014  . Obesity 03/10/2014  . Hypercholesterolemia 03/10/2014  . Varicose veins of lower extremities with other complications 0000000  . HNP (herniated nucleus pulposus), thoracic 03/14/2013    Past Surgical History:  Procedure Laterality Date  .  ABDOMINAL HYSTERECTOMY     total- lsh bso- fibroids  . BACK SURGERY    . CESAREAN SECTION    . COLONOSCOPY WITH PROPOFOL N/A 12/31/2015   Procedure: COLONOSCOPY WITH PROPOFOL;  Surgeon: Manya Silvas, MD;  Location: [facility] ENDOSCOPY;  Service: Endoscopy;  Laterality: N/A;  . ESOPHAGOGASTRODUODENOSCOPY (EGD) WITH PROPOFOL N/A 12/31/2015   Procedure: ESOPHAGOGASTRODUODENOSCOPY (EGD) WITH PROPOFOL;  Surgeon: Manya Silvas, MD;  Location: [facility] ENDOSCOPY;  Service: Endoscopy;  Laterality: N/A;  . THORACIC DISCECTOMY Right 03/14/2013   Procedure: THORACIC DISCECTOMY;  Surgeon: Charlie Pitter, MD;  Location: [facility] NEURO ORS;  Service: Neurosurgery;  Laterality: Right;  Thoracic four-five and thoracic nine-ten laminotomy with transpedicular microdiskectomy     No current facility-administered medications for this encounter.  Current Outpatient Medications:  .  amLODipine-benazepril (LOTREL) 5-20 MG per capsule, Take 1 capsule by mouth at bedtime., Disp: , Rfl:  .  hydrochlorothiazide (HYDRODIURIL) 25 MG tablet, Take 25 mg by mouth daily., Disp: , Rfl:  .  simvastatin (ZOCOR) 20 MG tablet, Take 20 mg by mouth at bedtime., Disp: , Rfl:  .  benzonatate (TESSALON) 200 MG capsule, Take 1 capsule (200 mg total) by mouth 3 (three) times daily as needed for cough., Disp: 30 capsule, Rfl: 0  Allergies Ibuprofen, Latex, and Penicillins  Family History  Problem Relation Age of Onset  . Crohn's disease Daughter   . Leukemia Mother   . Kidney failure Mother   . AAA (abdominal aortic aneurysm) Father   . Hypertension Father   . Cancer  Neg Hx   . Diabetes Neg Hx   . Heart disease Neg Hx     Social History Social History   Tobacco Use  . Smoking status: Former Smoker    Packs/day: 0.50    Years: 10.00    Pack years: 5.00    Types: Cigarettes    Quit date: 06/05/2017    Years since quitting: 2.8  . Smokeless tobacco: Never Used  Substance Use Topics  . Alcohol use: No  . Drug use: No    Review  of Systems Constitutional: No fever Cardiovascular: Denies chest pain. Respiratory: Denies shortness of breath. Gastrointestinal: No abdominal pain.  Musculoskeletal: Positive right leg pain. Skin: Negative for rash. Neurological: Negative for headaches, focal weakness or numbness.   ____________________________________________   PHYSICAL EXAM:  VITAL SIGNS: ED Triage Vitals  Enc Vitals Group     BP 03/26/20 1720 (!) 153/76     Pulse Rate 03/26/20 1720 (!) 56     Resp 03/26/20 1720 18     Temp 03/26/20 1720 97.6 F (36.4 C)     Temp Source 03/26/20 1720 Oral     SpO2 03/26/20 1720 97 %     Weight 03/26/20 1722 210 lb (95.3 kg)     Height 03/26/20 1722 5\' 2"  (1.575 m)     Head Circumference --      Peak Flow --      Pain Score 03/26/20 1721 5     Pain Loc --      Pain Edu? --      Excl. in [place]? --     Constitutional: Alert and oriented. Well appearing and in no acute distress. Eyes: Conjunctivae are normal.  ENT      Head: Normocephalic and atraumatic. Cardiovascular:Good peripheral circulation. Respiratory: Normal respiratory effort without tachypnea nor retractions.  Musculoskeletal: Steady gait.  Bilateral distal dorsalis pedis and posterior tibialis pulses equal. Right lower extremity varicose veins present, particularly to lateral shin and lateral distal thigh with mild tenderness to these areas.  Right medial to posterior calf moderate tenderness direct palpation without varicose vein present, no erythema, no ecchymosis, no point bony tenderness. Neurologic:  Normal speech and language. Skin:  Skin is warm, dry and intact. No rash noted. Psychiatric: Mood and affect are normal. Speech and behavior are normal. Patient exhibits appropriate insight and judgment   ___________________________________________   LABS (all labs ordered are listed, but only abnormal results are displayed)  Labs Reviewed - No data to  display ____________________________________________   PROCEDURES Procedures    INITIAL IMPRESSION / ASSESSMENT AND PLAN / ED COURSE  Pertinent labs & imaging results that were available during my care of the patient were reviewed by me and considered in my medical decision making (see chart for details).  Overall well-appearing patient.  No acute distress.  Right leg pain as above.  Denies injury or trauma.  Known varicose veins pain however producing for atypical gradual onset pain.  Recommend further evaluation emergency room for ultrasound, no ultrasound availability at this facility at this time.  Patient states she will go to [place] triage nurse called given report.  Stable at discharge.  Discussed follow up with Primary care physician this week. Discussed follow up and return parameters including no resolution or any worsening concerns. Patient verbalized understanding and agreed to plan.   ____________________________________________   FINAL CLINICAL IMPRESSION(S) / ED DIAGNOSES  Final diagnoses:  Right leg pain     ED Discharge Orders  None       Note: This dictation was prepared with Dragon dictation along with smaller phrase technology. Any transcriptional errors that result from this process are unintentional.         Marylene Land, NP 03/26/20 1911

## 2020-03-26 NOTE — Discharge Instructions (Addendum)
Go directly to emergency room as discussed.  °

## 2020-03-26 NOTE — ED Triage Notes (Signed)
Pt arrives to ED via POV from [place] with c/o right leg pain x2 weeks. Pt reports being sent here for an [place] to rule out a DVT. Pt reports pain in her calf and behind the knee. No known injury or trauma. Pt is A&O, in NAD; RR even, regular, and unlabored.

## 2020-03-27 ENCOUNTER — Emergency Department
Admission: EM | Admit: 2020-03-27 | Discharge: 2020-03-27 | Disposition: A | Discharge status: Home / Self Care | Payer: Commercial Managed Care - PPO | Attending: Emergency Medicine | Admitting: Emergency Medicine

## 2020-03-27 DIAGNOSIS — M79604 Pain in right leg: Secondary | ICD-10-CM

## 2020-03-27 LAB — CBC WITH DIFFERENTIAL/PLATELET
Abs Immature Granulocytes: 0.03 10*3/uL (ref 0.00–0.07)
Basophils Absolute: 0.1 10*3/uL (ref 0.0–0.1)
Basophils Relative: 2 %
Eosinophils Absolute: 0.2 10*3/uL (ref 0.0–0.5)
Eosinophils Relative: 3 %
HCT: 40.2 % (ref 36.0–46.0)
Hemoglobin: 13.2 g/dL (ref 12.0–15.0)
Immature Granulocytes: 0 %
Lymphocytes Relative: 44 %
Lymphs Abs: 3.3 10*3/uL (ref 0.7–4.0)
MCH: 30.1 pg (ref 26.0–34.0)
MCHC: 32.8 g/dL (ref 30.0–36.0)
MCV: 91.6 fL (ref 80.0–100.0)
Monocytes Absolute: 0.5 10*3/uL (ref 0.1–1.0)
Monocytes Relative: 7 %
Neutro Abs: 3.4 10*3/uL (ref 1.7–7.7)
Neutrophils Relative %: 44 %
Platelets: 221 10*3/uL (ref 150–400)
RBC: 4.39 MIL/uL (ref 3.87–5.11)
RDW: 13 % (ref 11.5–15.5)
WBC: 7.7 10*3/uL (ref 4.0–10.5)
nRBC: 0 % (ref 0.0–0.2)

## 2020-03-27 LAB — BASIC METABOLIC PANEL
Anion gap: 6 (ref 5–15)
BUN: 9 mg/dL (ref 8–23)
CO2: 27 mmol/L (ref 22–32)
Calcium: 8.9 mg/dL (ref 8.9–10.3)
Chloride: 107 mmol/L (ref 98–111)
Creatinine, Ser: 0.65 mg/dL (ref 0.44–1.00)
GFR calc Af Amer: >60 mL/min (ref >60)
GFR calc non Af Amer: >60 mL/min (ref >60)
Glucose, Bld: 98 mg/dL (ref 70–99)
Potassium: 3.7 mmol/L (ref 3.5–5.1)
Sodium: 140 mmol/L (ref 135–145)

## 2020-03-27 LAB — CK: Total CK: 140 U/L (ref 38–234)

## 2020-03-27 MED ORDER — HYDROCODONE-ACETAMINOPHEN 5-325 MG PO TABS: 1.0000 | ORAL_TABLET | Freq: Four times a day (QID) | ORAL | 0 refills | Status: DC | PRN

## 2020-03-27 MED ORDER — HYDROCODONE-ACETAMINOPHEN 5-325 MG PO TABS
1.0000 | ORAL_TABLET | Freq: Once | ORAL | Status: AC
  Administered 2020-03-27: 1 via ORAL
  Filled 2020-03-27: qty 1

## 2020-03-27 NOTE — ED Notes (Addendum)
See triage notes; pt is here from urgent care in [facility]; dr there recommended she go to the ER for an ultrasound to rule out a DVT in right leg.  Pt is in no acute distress at this time, states the pain comes and goes, leg is cool to the touch, tender upon palpation.

## 2020-03-27 NOTE — Discharge Instructions (Addendum)
1.  You may take Norco as needed for pain. 2.  You should have a repeat ultrasound of your right leg in 3 days' time.  Please talk with your doctor to schedule this. 3.  Return to the ER for worsening symptoms, persistent vomiting, difficulty breathing or other concerns.

## 2020-03-27 NOTE — ED Provider Notes (Signed)
[facility] Emergency Department Provider Note   ____________________________________________   First MD Initiated Contact with Patient 03/27/20 0102     (approximate)  I have reviewed the triage vital signs and the nursing notes.   HISTORY  Chief Complaint Leg Pain    HPI Sara Rhodes is a 64 y.o. female sent to the ED from urgent care for ultrasound to evaluate for right lower extremity DVT.  Patient has a history of varicose veins and has been experiencing right leg pain for the past several weeks.  Pain unrelieved by Tylenol.  Has been referred to vascular surgeon by her PCP.  Reports pain behind her calf and knee.  States most painful when dangling leg and first thing in the morning.  Recently started keto diet and has been walking more outdoors.  Denies chest pain, shortness of breath, abdominal pain, nausea, vomiting or dizziness.  Denies recent travel, trauma or hormone use      Past Medical History:  Diagnosis Date  . Acid reflux   . Behcet's disease ([place])   . Family history of anesthesia complication    N/V daughter  . Fatty liver   . Hyperlipemia   . Hypertension   . Obesity   . Schatzki's ring   . Tobacco use   . Vaginal atrophy   . Vulvar itching     Patient Active Problem List   Diagnosis Date Noted  . Status post hysterectomy with oophorectomy 01/21/2017  . Fatty liver 10/24/2015  . GERD (gastroesophageal reflux disease) 03/10/2014  . HTN (hypertension) 03/10/2014  . Obesity 03/10/2014  . Hypercholesterolemia 03/10/2014  . Varicose veins of lower extremities with other complications 0000000  . HNP (herniated nucleus pulposus), thoracic 03/14/2013    Past Surgical History:  Procedure Laterality Date  . ABDOMINAL HYSTERECTOMY     total- lsh bso- fibroids  . BACK SURGERY    . CESAREAN SECTION    . COLONOSCOPY WITH PROPOFOL N/A 12/31/2015   Procedure: COLONOSCOPY WITH PROPOFOL;  Surgeon: Manya Silvas, MD;   [facility] ENDOSCOPY;  Service: Endoscopy;  Laterality: N/A;  . ESOPHAGOGASTRODUODENOSCOPY (EGD) WITH PROPOFOL N/A 12/31/2015   Procedure: ESOPHAGOGASTRODUODENOSCOPY (EGD) WITH PROPOFOL;  Surgeon: Manya Silvas, MD;  Location[facility] END[facility]COPY;  Service: Endoscopy;  Laterality: N/A;  . THORACIC DISCECTOMY Right 03/14/2013   Procedure: THORACIC DISCECTOMY;  Surgeon: Charlie Pitter, MD;  Location: [place] NEURO ORS;  Service: Neurosurgery;  Laterality: Right;  Thoracic four-five and thoracic nine-ten laminotomy with transpedicular microdiskectomy    Prior to Admission medications   Medication Sig Start Date End Date Taking? Authorizing Provider  amLODipine-benazepril (LOTREL) 5-20 MG per capsule Take 1 capsule by mouth at bedtime.    [provider]  benzonatate (TESSALON) 200 MG capsule Take 1 capsule (200 mg total) by mouth 3 (three) times daily as needed for cough. 12/28/19   Coral Spikes, DO  hydrochlorothiazide (HYDRODIURIL) 25 MG tablet Take 25 mg by mouth daily.    [provider]  HYDROcodone-acetaminophen (NORCO) 5-325 MG tablet Take 1 tablet by mouth every 6 (six) hours as needed for moderate pain. 03/27/20   Paulette Blanch, MD  simvastatin (ZOCOR) 20 MG tablet Take 20 mg by mouth at bedtime.    [provider]  omeprazole (PRILOSEC) 20 MG capsule Take 20 mg by mouth daily.  12/28/19  [provider]    Allergies Ibuprofen, Latex, and Penicillins  Family History  Problem Relation Age of Onset  . Crohn's  disease Daughter   . Leukemia Mother   . Kidney failure Mother   . AAA (abdominal aortic aneurysm) Father   . Hypertension Father   . Cancer Neg Hx   . Diabetes Neg Hx   . Heart disease Neg Hx     Social History Social History   Tobacco Use  . Smoking status: Former Smoker    Packs/day: 0.50    Years: 10.00    Pack years: 5.00    Types: Cigarettes    Quit date: 06/05/2017    Years since quitting: 2.8  . Smokeless tobacco: Never Used    Substance Use Topics  . Alcohol use: No  . Drug use: No    Review of Systems  Constitutional: No fever/chills Eyes: No visual changes. ENT: No sore throat. Cardiovascular: Denies chest pain. Respiratory: Denies shortness of breath. Gastrointestinal: No abdominal pain.  No nausea, no vomiting.  No diarrhea.  No constipation. Genitourinary: Negative for dysuria. Musculoskeletal: Positive for right knee pain.  Negative for back pain. Skin: Negative for rash. Neurological: Negative for headaches, focal weakness or numbness.   ____________________________________________   PHYSICAL EXAM:  VITAL SIGNS: ED Triage Vitals  Enc Vitals Group     BP 03/26/20 1914 (!) 153/73     Pulse Rate 03/26/20 1914 (!) 53     Resp 03/26/20 1914 16     Temp 03/26/20 1914 97.9 F (36.6 C)     Temp Source 03/26/20 1914 Oral     SpO2 03/26/20 1914 98 %     Weight 03/26/20 1912 210 lb (95.3 kg)     Height 03/26/20 1912 5\' 2"  (1.575 m)     Head Circumference --      Peak Flow --      Pain Score 03/26/20 1912 4     Pain Loc --      Pain Edu? --      Excl. in [place]? --     Constitutional: Alert and oriented. Well appearing and in no acute distress. Eyes: Conjunctivae are normal. PERRL. EOMI. Head: Atraumatic. Nose: No congestion/rhinnorhea. Mouth/Throat: Mucous membranes are moist.  Oropharynx non-erythematous. Neck: No stridor.   Cardiovascular: Normal rate, regular rhythm. Grossly normal heart sounds.  Good peripheral circulation. Respiratory: Normal respiratory effort.  No retractions. Lungs CTAB. Gastrointestinal: Soft and nontender. No distention. No abdominal bruits. No CVA tenderness. Musculoskeletal:  RLE: Tender to palpation posterior knee.  Varicose veins noted.  Calf is supple.  Symmetrically warm limb without evidence for ischemia.  2+ distal pulses.  Brisk, less than 5-second cap refill.  No pedal edema.  No joint effusions. Neurologic:  Normal speech and language. No gross focal  neurologic deficits are appreciated. No gait instability. Skin:  Skin is warm, dry and intact. No rash noted. Psychiatric: Mood and affect are normal. Speech and behavior are normal.  ____________________________________________   LABS (all labs ordered are listed, but only abnormal results are displayed)  Labs Reviewed  CBC WITH DIFFERENTIAL/PLATELET  BASIC METABOLIC PANEL  CK   ____________________________________________  EKG  None ____________________________________________  RADIOLOGY  ED MD interpretation: No DVT  Official radiology report(s): US Venous Img Lower Unilateral Right  Result Date: 03/26/2020 CLINICAL DATA:  Right popliteal fossa pain EXAM: RIGHT LOWER EXTREMITY VENOUS DOPPLER ULTRASOUND TECHNIQUE: Gray-scale sonography with compression, as well as color and duplex ultrasound, were performed to evaluate the deep venous system(s) from the level of the common femoral vein through the popliteal and proximal calf veins. COMPARISON:  None. FINDINGS: VENOUS  Normal compressibility of the common femoral, superficial femoral, and popliteal veins, as well as the visualized calf veins. Visualized portions of profunda femoral vein and great saphenous vein unremarkable. No filling defects to suggest DVT on grayscale or color Doppler imaging. Doppler waveforms show normal direction of venous flow, normal respiratory plasticity and response to augmentation. Limited views of the contralateral common femoral vein are unremarkable. OTHER None. Limitations: none IMPRESSION: Negative. Electronically Signed   By: Randa Ngo M.D.   On: 03/26/2020 20:37    ____________________________________________   PROCEDURES  Procedure(s) performed (including Critical Care):  Procedures   ____________________________________________   INITIAL IMPRESSION / ASSESSMENT AND PLAN / ED COURSE  As part of my medical decision making, I reviewed the following data within the [place] History obtained from family, Nursing notes reviewed and incorporated, Labs reviewed, Radiograph reviewed, Notes from prior ED visits and  Controlled Substance Database     AZHA SCHERMERHORN was evaluated in Emergency Department on 03/27/2020 for the symptoms described in the history of present illness. She was evaluated in the context of the global COVID-19 pandemic, which necessitated consideration that the patient might be at risk for infection with the SARS-CoV-2 virus that causes COVID-19. Institutional protocols and algorithms that pertain to the evaluation of patients at risk for COVID-19 are in a state of rapid change based on information released by regulatory bodies including the CDC and federal and state organizations. These policies and algorithms were followed during the patient's care in the ED.    65 year old female presenting with several weeks history of right posterior knee/calf pain.  Differential diagnosis includes but is not limited to DVT, Baker's cyst, musculoskeletal, phlebitis, etc.  Ultrasound negative for DVT.  Since patient recently started keto diet and has been exercising more outdoors, will check electrolytes and CK.  Will administer Norco for pain.   Clinical Course as of Mar 27 200  Tue Mar 27, 2020  0158 Updated patient and spouse of laboratory results.  Will discharge home on Norco to use as needed.  Advised patient to have repeat ultrasound in 3 days' time.  Strict return precautions given.  Patient verbalizes understanding agrees with plan of care.   [JS]    Clinical Course User Index [JS] Paulette Blanch, MD     ____________________________________________   FINAL CLINICAL IMPRESSION(S) / ED DIAGNOSES  Final diagnoses:  Right leg pain     ED Discharge Orders         Ordered    HYDROcodone-acetaminophen (NORCO) 5-325 MG tablet  Every 6 hours PRN     03/27/20 0200           Note:  This document was prepared using Dragon  voice recognition software and may include unintentional dictation errors.   Paulette Blanch, MD 03/27/20 [phone]

## 2020-03-29 ENCOUNTER — Ambulatory Visit
Admission: RE | Admit: 2020-03-29 | Discharge: 2020-03-29 | Disposition: A | Discharge status: Home / Self Care | Payer: Commercial Managed Care - PPO | Source: Ambulatory Visit | Attending: Family Medicine | Admitting: Family Medicine

## 2020-03-29 ENCOUNTER — Other Ambulatory Visit: Payer: Self-pay

## 2020-03-29 ENCOUNTER — Other Ambulatory Visit: Payer: Self-pay | Admitting: Family Medicine

## 2020-03-29 DIAGNOSIS — M79604 Pain in right leg: Secondary | ICD-10-CM | POA: Diagnosis present

## 2020-03-30 ENCOUNTER — Other Ambulatory Visit (INDEPENDENT_AMBULATORY_CARE_PROVIDER_SITE_OTHER): Payer: Self-pay | Admitting: Nurse Practitioner

## 2020-03-30 DIAGNOSIS — I83819 Varicose veins of unspecified lower extremities with pain: Secondary | ICD-10-CM

## 2020-04-04 ENCOUNTER — Ambulatory Visit (INDEPENDENT_AMBULATORY_CARE_PROVIDER_SITE_OTHER): Payer: Commercial Managed Care - PPO

## 2020-04-04 ENCOUNTER — Other Ambulatory Visit: Payer: Self-pay

## 2020-04-04 ENCOUNTER — Encounter (INDEPENDENT_AMBULATORY_CARE_PROVIDER_SITE_OTHER): Payer: Self-pay | Admitting: Nurse Practitioner

## 2020-04-04 ENCOUNTER — Encounter: Payer: PRIVATE HEALTH INSURANCE | Admitting: Obstetrics and Gynecology

## 2020-04-04 ENCOUNTER — Ambulatory Visit (INDEPENDENT_AMBULATORY_CARE_PROVIDER_SITE_OTHER): Payer: Commercial Managed Care - PPO | Admitting: Nurse Practitioner

## 2020-04-04 VITALS — BP 130/80 | HR 61 | Ht 62.0 in | Wt 213.0 lb

## 2020-04-04 DIAGNOSIS — I10 Essential (primary) hypertension: Secondary | ICD-10-CM | POA: Diagnosis not present

## 2020-04-04 DIAGNOSIS — M7121 Synovial cyst of popliteal space [Baker], right knee: Secondary | ICD-10-CM | POA: Diagnosis not present

## 2020-04-04 DIAGNOSIS — I83893 Varicose veins of bilateral lower extremities with other complications: Secondary | ICD-10-CM | POA: Diagnosis not present

## 2020-04-04 DIAGNOSIS — I83819 Varicose veins of unspecified lower extremities with pain: Secondary | ICD-10-CM | POA: Diagnosis not present

## 2020-04-04 NOTE — Progress Notes (Signed)
Subjective:    Patient ID: Sara CREVELING, female    DOB: 1956/04/17, 64 y.o.   MRN: [id number] Chief Complaint  Patient presents with  . New Patient (Initial Visit)    Varicose vein w/ pain BLE reflux    The patient presents today after having severe leg pain for approximately the last 3 weeks.  The patient notes that the pain is concentrated within the back of her leg and the knee area.  The pain and discomfort began sometime in March however it has steadily grown worse.  The patient has recently been started on prednisone in addition to hydrocodone, however despite these interventions she continues to have pain discomfort.  She recently saw orthopedic surgery which reveals that she could possibly have a torn meniscus but typically physical therapy is necessary before an MRI can be ordered to confirm.  The patient notes that recently it has been very difficult for her to walk on this leg due to the pain.  She also notes that when she sits such as when using the restroom she has a lot of pressure and is unable to sit directly on that leg.  She denies any precipitating injury.  Today noninvasive studies show no evidence of DVT in the bilateral lower extremities.  No evidence of superficial venous thrombosis bilaterally.  No evidence of deep venous insufficiency bilaterally.  No evidence of superficial venous reflux seen in the great or short saphenous veins bilaterally.  There does appear to be a Baker's cyst behind the right knee measuring 2.92 cm x 1.89 cm.   Review of Systems  Musculoskeletal: Positive for arthralgias and joint swelling.  All other systems reviewed and are negative.      Objective:   Physical Exam Vitals reviewed.  HENT:     Head: Normocephalic.  Cardiovascular:     Rate and Rhythm: Normal rate and regular rhythm.  Musculoskeletal:     Right knee: Swelling present. Decreased range of motion. Tenderness present.  Neurological:     Mental Status: She is alert and  oriented to person, place, and time.  Psychiatric:        Mood and Affect: Mood normal.        Behavior: Behavior normal.        Thought Content: Thought content normal.        Judgment: Judgment normal.     BP 130/80   Pulse 61   Ht 5\' 2"  (1.575 m)   Wt 213 lb (96.6 kg)   BMI 38.96 kg/m   Past Medical History:  Diagnosis Date  . Acid reflux   . Behcet's disease ([place])   . Family history of anesthesia complication    N/V daughter  . Fatty liver   . Hyperlipemia   . Hypertension   . Obesity   . Schatzki's ring   . Tobacco use   . Vaginal atrophy   . Vulvar itching     Social History   Socioeconomic History  . Marital status: Married    Spouse name: Not on file  . Number of children: Not on file  . Years of education: Not on file  . Highest education level: Not on file  Occupational History  . Not on file  Tobacco Use  . Smoking status: Former Smoker    Packs/day: 0.50    Years: 10.00    Pack years: 5.00    Types: Cigarettes    Quit date: 06/05/2017    Years since quitting: 2.8  .  Smokeless tobacco: Never Used  Substance and Sexual Activity  . Alcohol use: No  . Drug use: No  . Sexual activity: Yes    Birth control/protection: Surgical  Other Topics Concern  . Not on file  Social History Narrative  . Not on file   Social Determinants of Health   Financial Resource Strain:   . Difficulty of Paying Living Expenses:   Food Insecurity:   . Worried About Charity fundraiser in the Last Year:   . Arboriculturist in the Last Year:   Transportation Needs:   . Film/video editor (Medical):   Marland Kitchen Lack of Transportation (Non-Medical):   Physical Activity:   . Days of Exercise per Week:   . Minutes of Exercise per Session:   Stress:   . Feeling of Stress :   Social Connections:   . Frequency of Communication with Friends and Family:   . Frequency of Social Gatherings with Friends and Family:   . Attends Religious Services:   . Active Member of Clubs or  Organizations:   . Attends Archivist Meetings:   Marland Kitchen Marital Status:   Intimate Partner Violence:   . Fear of Current or Ex-Partner:   . Emotionally Abused:   Marland Kitchen Physically Abused:   . Sexually Abused:     Past Surgical History:  Procedure Laterality Date  . ABDOMINAL HYSTERECTOMY     total- lsh bso- fibroids  . BACK SURGERY    . CESAREAN SECTION    . COLONOSCOPY WITH PROPOFOL N/A 12/31/2015   Procedure: COLONOSCOPY WITH PROPOFOL;  Surgeon: Manya Silvas, MD;  [facility] END[facility]COPY;  Service: Endoscopy;  Laterality: N/A;  . ESOPHAGOGASTRODUODENOSCOPY (EGD) WITH PROPOFOL N/A 12/31/2015   Procedure: ESOPHAGOGASTRODUODENOSCOPY (EGD) WITH PROPOFOL;  Surgeon: Manya Silvas, MD;  Location: [facility] ENDOSCOPY;  Service: Endoscopy;  Laterality: N/A;  . THORACIC DISCECTOMY Right 03/14/2013   Procedure: THORACIC DISCECTOMY;  Surgeon: Charlie Pitter, MD;  Location: [place] NEURO ORS;  Service: Neurosurgery;  Laterality: Right;  Thoracic four-five and thoracic nine-ten laminotomy with transpedicular microdiskectomy    Family History  Problem Relation Age of Onset  . Crohn's disease Daughter   . Leukemia Mother   . Kidney failure Mother   . AAA (abdominal aortic aneurysm) Father   . Hypertension Father   . Cancer Neg Hx   . Diabetes Neg Hx   . Heart disease Neg Hx     Allergies  Allergen Reactions  . Ibuprofen Nausea Only  . Latex Itching and Rash  . Penicillins Itching and Rash       Assessment & Plan:   1. Baker's cyst of knee, right Based on the patient's description of pain and noninvasive studies, it is possible that her pain is related to a ruptured Baker's cyst.  Patient is advised to continue treatments prescribed by orthopedic however she is also advised to follow back up with orthopedics for further possible evaluation based on the noninvasive studies seen today.  2. Hypertension, unspecified type Continue antihypertensive medications as already ordered, these  medications have been reviewed and there are no changes at this time.   3. Varicose veins of bilateral lower extremities with other complications The patient does have visible varicosities in her right lower extremity however the patient does not have evidence of superficial venous reflux.  Because of this, treatment would continue to be conservative which would include wearing medical grade 1 compression stockings, elevation and exercise.  Currently the patient is  not able to wear compression due to the pain it causes with the pressure on her knee however the patient will begin wearing them as soon as she is able.  Otherwise, patient will follow up on an as-needed basis.  Current Outpatient Medications on File Prior to Visit  Medication Sig Dispense Refill  . amLODipine-benazepril (LOTREL) 5-20 MG per capsule Take 1 capsule by mouth at bedtime.    . hydrochlorothiazide (HYDRODIURIL) 25 MG tablet Take 25 mg by mouth daily.    Marland Kitchen HYDROcodone-acetaminophen (NORCO) 5-325 MG tablet Take 1 tablet by mouth every 6 (six) hours as needed for moderate pain. 15 tablet 0  . naproxen sodium (ALEVE) 220 MG tablet Take by mouth.    . nystatin (MYCOSTATIN) 100000 UNIT/ML suspension     . predniSONE (DELTASONE) 20 MG tablet Take 40 mg by mouth daily.    . simvastatin (ZOCOR) 20 MG tablet Take 20 mg by mouth at bedtime.    . valACYclovir (VALTREX) 1000 MG tablet At first sign of outbreak, take 1 tablet (1,000 mg total) by mouth twice daily for 2-5 days (less if lesion resolves).    . benzonatate (TESSALON) 200 MG capsule Take 1 capsule (200 mg total) by mouth 3 (three) times daily as needed for cough. (Patient not taking: Reported on 04/04/2020) 30 capsule 0  . [DISCONTINUED] omeprazole (PRILOSEC) 20 MG capsule Take 20 mg by mouth daily.     No current facility-administered medications on file prior to visit.    There are no Patient Instructions on file for this visit. Return if symptoms worsen or fail to  improve.   Kris Hartmann, NP

## 2020-04-18 ENCOUNTER — Encounter: Payer: PRIVATE HEALTH INSURANCE | Admitting: Obstetrics and Gynecology

## 2020-04-23 ENCOUNTER — Other Ambulatory Visit: Payer: Self-pay | Admitting: Orthopedic Surgery

## 2020-04-23 DIAGNOSIS — M2391 Unspecified internal derangement of right knee: Secondary | ICD-10-CM

## 2020-05-03 ENCOUNTER — Other Ambulatory Visit: Payer: Self-pay

## 2020-05-03 ENCOUNTER — Ambulatory Visit
Admission: RE | Admit: 2020-05-03 | Discharge: 2020-05-03 | Disposition: A | Discharge status: Home / Self Care | Payer: Commercial Managed Care - PPO | Source: Ambulatory Visit | Attending: Orthopedic Surgery | Admitting: Orthopedic Surgery

## 2020-05-03 DIAGNOSIS — M2391 Unspecified internal derangement of right knee: Secondary | ICD-10-CM | POA: Insufficient documentation

## 2020-05-06 ENCOUNTER — Encounter (INDEPENDENT_AMBULATORY_CARE_PROVIDER_SITE_OTHER): Payer: Self-pay

## 2020-06-14 ENCOUNTER — Other Ambulatory Visit: Payer: Self-pay

## 2020-06-14 ENCOUNTER — Encounter
Admission: RE | Admit: 2020-06-14 | Discharge: 2020-06-14 | Disposition: A | Discharge status: Home / Self Care | Payer: Commercial Managed Care - PPO | Source: Ambulatory Visit | Attending: Orthopedic Surgery | Admitting: Orthopedic Surgery

## 2020-06-14 NOTE — Patient Instructions (Signed)
Your procedure is scheduled on: 06/25/20 Report to [place]. To find out your arrival time please call [phone] between 1PM - 3PM on 06/22/20.  Remember: Instructions that are not followed completely may result in serious medical risk, up to and including death, or upon the discretion of your surgeon and anesthesiologist your surgery may need to be rescheduled.     _X__ 1. Do not eat food after midnight the night before your procedure.                 No gum chewing or hard candies. You may drink clear liquids up to 2 hours                 before you are scheduled to arrive for your surgery- DO not drink clear                 liquids within 2 hours of the start of your surgery.                 Clear Liquids include:  water, apple juice without pulp, clear carbohydrate                 drink such as Clearfast or Gatorade, Black Coffee or Tea (Do not add                 anything to coffee or tea). Diabetics water only  >>>>> Finish the Ensure Pre Surgery drink 2 hours prior to arrival.<<<<<<  __X__2.  On the morning of surgery brush your teeth with toothpaste and water, you                 may rinse your mouth with mouthwash if you wish.  Do not swallow any              toothpaste of mouthwash.     _X__ 3.  No Alcohol for 24 hours before or after surgery.   _X__ 4.  Do Not Smoke or use e-cigarettes For 24 Hours Prior to Your Surgery.                 Do not use any chewable tobacco products for at least 6 hours prior to                 surgery.  ____  5.  Bring all medications with you on the day of surgery if instructed.   __X__  6.  Notify your doctor if there is any change in your medical condition      (cold, fever, infections).     Do not wear jewelry, make-up, hairpins, clips or nail polish. Do not wear lotions, powders, or perfumes.  Do not shave 48 hours prior to surgery. Men may shave face and neck. Do not bring  valuables to the hospital.    [facility] is not responsible for any belongings or valuables.  Contacts, dentures/partials or body piercings may not be worn into surgery. Bring a case for your contacts, glasses or hearing aids, a denture cup will be supplied. Leave your suitcase in the car. After surgery it may be brought to your room. For patients admitted to the hospital, discharge time is determined by your treatment team.   Patients discharged the day of surgery will not be allowed to drive home.   Please read over the following fact sheets that you were given:   MRSA Information  __X__ Take  these medicines the morning of surgery with A SIP OF WATER:    1. levocetirizine (XYZAL ALLERGY 24HR) 5 MG tablet if needed  2. valACYclovir (VALTREX) 1000 MG tablet if needed  3.   4.  5.  6.  ____ Fleet Enema (as directed)   __X__ Use CHG Soap/SAGE wipes as directed  ____ Use inhalers on the day of surgery  ____ Stop metformin/Janumet/Farxiga 2 days prior to surgery    ____ Take 1/2 of usual insulin dose the night before surgery. No insulin the morning          of surgery.   ____ Stop Blood Thinners Coumadin/Plavix/Xarelto/Pleta/Pradaxa/Eliquis/Effient/Aspirin  on   Or contact your Surgeon, Cardiologist or Medical Doctor regarding  ability to stop your blood thinners  __X__ Stop Anti-inflammatories 7 days before surgery such as Advil, Ibuprofen, Motrin,  BC or Goodies Powder, Naprosyn, Naproxen, Aleve, Aspirin    __X__ Stop all herbal supplements, fish oil or vitamin E until after surgery.    ____ Bring C-Pap to the hospital.

## 2020-06-14 NOTE — H&P (Signed)
ORTHOPAEDIC HISTORY & PHYSICAL Progress Notes Esperansa Sarabia, Florinda Marker., MD - 06/07/2020 4:00 PM EDT Chief Complaint: Chief Complaint  Patient presents with  . Knee Pain  Right knee MRI results, discuss surgery   Reason for Visit: The patient is a 64 y.o. female who presents today for reevaluation of her right knee. She reports a 3 month(s) history of right knee pain. She does not recall any specific trauma or aggravating event. She localizes most of the pain along the medial aspect of the knee. She reports some swelling, no locking, and some giving way of the knee. The pain is aggravated by any weight bearing and lateral movements. The patient has not appreciated any significant improvement despite Tylenol, NSAIDs, activity modification, and physical therapy.   Medications: Current Outpatient Medications  Medication Sig Dispense Refill  . amLODIPine-benazepril (LOTREL) 5-20 mg capsule Take 1 capsule by mouth once daily 90 capsule 1  . cholecalciferol (CHOLECALCIFEROL) 1,000 unit tablet Take 1 tablet (1,000 Units total) by mouth once daily. 90 tablet 1  . clobetasoL (TEMOVATE) 0.05 % ointment Apply topically as needed  . hydroCHLOROthiazide (HYDRODIURIL) 25 MG tablet TAKE ONE TABLET BY MOUTH ONCE DAILY 90 tablet 0  . naproxen sodium (ALEVE, ANAPROX) 220 MG tablet Take 220 mg by mouth 2 (two) times daily as needed for Pain  . nystatin-triamcinolone cream Apply topically as needed  . omeprazole (PRILOSEC) 20 MG DR capsule Take 1 capsule (20 mg total) by mouth once daily (Patient taking differently: Take 20 mg by mouth once daily as needed ) 90 capsule 1  . simvastatin (ZOCOR) 20 MG tablet Take 1 tablet (20 mg total) by mouth nightly 90 tablet 1  . triamcinolone 0.1 % cream Apply topically 2 (two) times daily Apply to affected areas 2 times per day as need (may use for 7-10 days, then take 7 days off, may repeat as needed 30 g 0  . valACYclovir (VALTREX) 1000 MG tablet At first sign of outbreak,  take 1 tablet (1,000 mg total) by mouth twice daily for 2-5 days (less if lesion resolves). 30 tablet 0   No current facility-administered medications for this visit.   Allergies: Allergies  Allergen Reactions  . Penicillins Hives  . Ibuprofen Abdominal Pain  Upsets stomach  . Latex Rash   Past Medical History: Past Medical History:  Diagnosis Date  . Asthma without status asthmaticus, unspecified  when a child  . GERD (gastroesophageal reflux disease)  . Hyperlipidemia  . Hypertension  . Obesity  . Phlebitis  . Tobacco abuse 03/10/2014   Past Surgical History: Past Surgical History:  Procedure Laterality Date  . APPENDECTOMY  . COLONOSCOPY 09/09/2010  Adenomatous Polyp: CBF 09/2015  . COLONOSCOPY 12/31/2015  PH Adenomatous Polyp: CBF 12/2020  . EGD 12/31/2015  No repeat per RTE  . ESOPHAGEAL DILATION 11/2016  . HYSTERECTOMY  . LAMINECTOMY LUMBAR SPINE   Social History: Social History   Socioeconomic History  . Marital status: Married  Spouse name: Francee Piccolo  . Number of children: 3  . Years of education: 36  . Highest education level: Not on file  Occupational History  . Occupation: Corporate treasurer Rep  Tobacco Use  . Smoking status: Former Smoker  Packs/day: 0.50  Years: 10.00  Pack years: 5.00  Types: Cigarettes  Quit date: 2018  Years since quitting: 3.6  . Smokeless tobacco: Never Used  Vaping Use  . Vaping Use: Never used  Substance and Sexual Activity  . Alcohol use: No  Alcohol/week: 0.0  standard drinks  . Drug use: No  . Sexual activity: Defer  Partners: Male  Comment: married  Other Topics Concern  . Not on file  Social History Narrative  Married, 3 kids that are grown  Currently looking for a job  Religious Affiliation: Christian/[employer]   Social Determinants of Health   Financial Resource Strain:  . Difficulty of Paying Living Expenses:  Food Insecurity:  . Worried About Charity fundraiser in the Last Year:  . Arboriculturist in  the Last Year:  Transportation Needs:  . Film/video editor (Medical):  Marland Kitchen Lack of Transportation (Non-Medical):  Physical Activity:  . Days of Exercise per Week:  . Minutes of Exercise per Session:  Stress:  . Feeling of Stress :  Social Connections:  . Frequency of Communication with Friends and Family:  . Frequency of Social Gatherings with Friends and Family:  . Attends Religious Services:  . Active Member of Clubs or Organizations:  . Attends Archivist Meetings:  Marland Kitchen Marital Status:   Family History: Family History  Problem Relation Age of Onset  . Leukemia Mother  . High blood pressure (Hypertension) Mother  . Aneurysm Father  . High blood pressure (Hypertension) Father  . Stroke Brother  . High blood pressure (Hypertension) Brother  . High blood pressure (Hypertension) Sister   Review of Systems: A comprehensive 14 point ROS was performed, reviewed, and the pertinent orthopaedic findings are documented in the HPI.  Exam BP 134/82  Temp 36.1 C (97 F)  Ht 157.5 cm (5\' 2" )  Wt 99.1 kg (218 lb 6.4 oz)  BMI 39.95 kg/m   General:  Well-developed, well-nourished female seen in no acute distress.  Antalgic gait.  No varus or valgus thrust to the right knee.  HEENT:  Atraumatic, normocephalic. Pupils are equal and reactive to light. Extraocular motion is intact. Sclera are clear. Oropharynx is clear with moist mucosa.  Lungs:  Clear to auscultation bilaterally.  Cardiovascular: Regular rate and rhythm. Normal S1, S2. No murmur . No appreciable gallops or rubs. Peripheral pulses are palpable. No lower extremity edema. Homan`s test is negative.   Extremities: Good strength, stability, and range of motion of the upper extremities. Good range of motion of the hips and ankles.  Right Knee:  Soft tissue swelling: mild Effusion: mild Erythema: none Crepitance: minimal Tenderness: medial Alignment: normal Mediolateral laxity: stable Anterior drawer  test:negative Lachman`s test: negative McMurray`s test: positive Atrophy: No significant atrophy.  Quadriceps tone was fair to good. Range of Motion: Greater than 110 degrees  Neurologic:  Awake, alert, and oriented.  Sensory function is intact to pinprick and light touch.  Motor strength is judged to be 5/5.  Motor coordination is within normal limits.  No apparent clonus. No tremor.   MRI: I reviewed the right knee MRI from [facility] dated 05/03/2020. I concur with the radiologist's interpretation as below:  MRI OF THE RIGHT KNEE WITHOUT CONTRAST   TECHNIQUE:  Multiplanar, multisequence MR imaging of the knee was performed. No  intravenous contrast was administered.   COMPARISON: Report only from outside radiographs 03/31/2020   FINDINGS:  MENISCI   Medial meniscus: Irregular degenerative tear involving the meniscal  body, best seen on the coronal images. There is probable extension  of an oblique component into the posterior horn, extending to the  inferior articular surface. No centrally displaced meniscal  fragments. The meniscal root is intact.   Lateral meniscus: Intact with normal morphology.  LIGAMENTS   Cruciates: Intact.   Collaterals: Intact. Mild MCL degeneration with trace fluid in the  pes anserine bursa.   CARTILAGE   Patellofemoral: Mild chondral thinning and surface irregularity  without full-thickness defect.   Medial: Moderate chondral thinning and surface irregularity with  peripheral osteophytes and mild subchondral cyst formation  posteriorly in the medial femoral condyle. There is mild peripheral  subchondral edema in the medial tibial plateau.   Lateral: Preserved.   MISCELLANEOUS   Joint: Small joint effusion.   Popliteal Fossa: Small amount of fluid within the popliteus hiatus  and mild edema around the soleus muscle. No Baker's cyst.   Extensor Mechanism: Intact.   Bones: No acute or significant  extra-articular osseous findings.   Other: Prominent superficial varicosities laterally.   IMPRESSION:  1. Irregular degenerative tear of the body and posterior horn of the  medial meniscus.  2. Moderate medial compartment osteoarthritis.  3. The lateral meniscus, cruciate and collateral ligamentsare  intact.  4. Small joint effusion and superficial varicosities laterally.   Electronically Signed  By: Richardean Sale M.D.  On: 05/04/2020 14:11  Impression: Internal derangement of the right knee  Plan:  The findings were discussed in detail with the patient. The patient was given informational material on knee arthroscopy. Conservative treatment options were reviewed with the patient. We discussed the risks and benefits of surgical intervention. The usual perioperative course was also discussed in detail. Arthroscopy is an appropriate treatment for the meniscal pathology, but would have limited or no effect on degenerative changes of the articular cartilage. The patient expressed understanding of the risks and benefits of surgical intervention and would like to proceed with plans for right knee arthroscopy.  MEDICAL CLEARANCE: Per anesthesiology. ACTIVITIES:  Avoid pivoting, squatting, or twisting. WORK STATUS: Not applicable. THERAPY: Quadriceps strengthening exercises. MEDICATIONS: Requested Prescriptions   No prescriptions requested or ordered in this encounter   FOLLOW-UP: Return for preop History & Physical pending surgery date.   Ilan Kahrs P. Holley Bouche., M.D.  This note was generated in part with voice recognition software and I apologize for any typographical errors that were not detected and corrected.   Electronically signed by Lamar Benes., MD at 06/09/2020 10:24 PM EDT

## 2020-06-18 ENCOUNTER — Other Ambulatory Visit: Payer: Self-pay

## 2020-06-18 ENCOUNTER — Encounter
Admission: RE | Admit: 2020-06-18 | Discharge: 2020-06-18 | Disposition: A | Discharge status: Home / Self Care | Payer: Commercial Managed Care - PPO | Source: Ambulatory Visit | Attending: Orthopedic Surgery | Admitting: Orthopedic Surgery

## 2020-06-18 DIAGNOSIS — I10 Essential (primary) hypertension: Secondary | ICD-10-CM | POA: Diagnosis present

## 2020-06-18 DIAGNOSIS — E785 Hyperlipidemia, unspecified: Secondary | ICD-10-CM | POA: Diagnosis not present

## 2020-06-20 LAB — LATEX, IGE: Latex: <0.1 kU/L

## 2020-06-21 ENCOUNTER — Other Ambulatory Visit: Payer: Self-pay

## 2020-06-21 ENCOUNTER — Other Ambulatory Visit
Admission: RE | Admit: 2020-06-21 | Discharge: 2020-06-21 | Disposition: A | Discharge status: Home / Self Care | Payer: Commercial Managed Care - PPO | Source: Ambulatory Visit | Attending: Orthopedic Surgery | Admitting: Orthopedic Surgery

## 2020-06-21 DIAGNOSIS — Z01812 Encounter for preprocedural laboratory examination: Secondary | ICD-10-CM | POA: Insufficient documentation

## 2020-06-21 DIAGNOSIS — Z20822 Contact with and (suspected) exposure to covid-19: Secondary | ICD-10-CM | POA: Insufficient documentation

## 2020-06-22 LAB — SARS CORONAVIRUS 2 (TAT 6-24 HRS): SARS Coronavirus 2: NEGATIVE

## 2020-06-23 ENCOUNTER — Encounter: Payer: Self-pay | Admitting: Orthopedic Surgery

## 2020-06-24 ENCOUNTER — Encounter: Payer: Self-pay | Admitting: Orthopedic Surgery

## 2020-06-25 ENCOUNTER — Ambulatory Visit: Payer: Commercial Managed Care - PPO | Admitting: Certified Registered Nurse Anesthetist

## 2020-06-25 ENCOUNTER — Other Ambulatory Visit: Payer: Self-pay

## 2020-06-25 ENCOUNTER — Encounter
Admission: RE | Disposition: A | Discharge status: Home / Self Care | Payer: Self-pay | Source: Home / Self Care | Attending: Orthopedic Surgery

## 2020-06-25 ENCOUNTER — Ambulatory Visit
Admission: RE | Admit: 2020-06-25 | Discharge: 2020-06-25 | Disposition: A | Discharge status: Home / Self Care | Payer: Commercial Managed Care - PPO | Attending: Orthopedic Surgery | Admitting: Orthopedic Surgery

## 2020-06-25 ENCOUNTER — Encounter: Payer: Self-pay | Admitting: Orthopedic Surgery

## 2020-06-25 DIAGNOSIS — Z823 Family history of stroke: Secondary | ICD-10-CM | POA: Insufficient documentation

## 2020-06-25 DIAGNOSIS — S83231A Complex tear of medial meniscus, current injury, right knee, initial encounter: Secondary | ICD-10-CM | POA: Insufficient documentation

## 2020-06-25 DIAGNOSIS — Z79899 Other long term (current) drug therapy: Secondary | ICD-10-CM | POA: Diagnosis not present

## 2020-06-25 DIAGNOSIS — Z6838 Body mass index (BMI) 38.0-38.9, adult: Secondary | ICD-10-CM | POA: Insufficient documentation

## 2020-06-25 DIAGNOSIS — K219 Gastro-esophageal reflux disease without esophagitis: Secondary | ICD-10-CM | POA: Insufficient documentation

## 2020-06-25 DIAGNOSIS — Z806 Family history of leukemia: Secondary | ICD-10-CM | POA: Insufficient documentation

## 2020-06-25 DIAGNOSIS — E785 Hyperlipidemia, unspecified: Secondary | ICD-10-CM | POA: Insufficient documentation

## 2020-06-25 DIAGNOSIS — E669 Obesity, unspecified: Secondary | ICD-10-CM | POA: Diagnosis not present

## 2020-06-25 DIAGNOSIS — M94261 Chondromalacia, right knee: Secondary | ICD-10-CM | POA: Diagnosis not present

## 2020-06-25 DIAGNOSIS — Z888 Allergy status to other drugs, medicaments and biological substances status: Secondary | ICD-10-CM | POA: Diagnosis not present

## 2020-06-25 DIAGNOSIS — Y939 Activity, unspecified: Secondary | ICD-10-CM | POA: Insufficient documentation

## 2020-06-25 DIAGNOSIS — Z8249 Family history of ischemic heart disease and other diseases of the circulatory system: Secondary | ICD-10-CM | POA: Insufficient documentation

## 2020-06-25 DIAGNOSIS — Z886 Allergy status to analgesic agent status: Secondary | ICD-10-CM | POA: Insufficient documentation

## 2020-06-25 DIAGNOSIS — X58XXXA Exposure to other specified factors, initial encounter: Secondary | ICD-10-CM | POA: Insufficient documentation

## 2020-06-25 DIAGNOSIS — Z87891 Personal history of nicotine dependence: Secondary | ICD-10-CM | POA: Diagnosis not present

## 2020-06-25 DIAGNOSIS — Z880 Allergy status to penicillin: Secondary | ICD-10-CM | POA: Diagnosis not present

## 2020-06-25 DIAGNOSIS — Z90710 Acquired absence of both cervix and uterus: Secondary | ICD-10-CM | POA: Insufficient documentation

## 2020-06-25 DIAGNOSIS — Z91040 Latex allergy status: Secondary | ICD-10-CM | POA: Insufficient documentation

## 2020-06-25 DIAGNOSIS — I10 Essential (primary) hypertension: Secondary | ICD-10-CM | POA: Insufficient documentation

## 2020-06-25 DIAGNOSIS — Z9889 Other specified postprocedural states: Secondary | ICD-10-CM

## 2020-06-25 DIAGNOSIS — S83241A Other tear of medial meniscus, current injury, right knee, initial encounter: Secondary | ICD-10-CM | POA: Diagnosis present

## 2020-06-25 HISTORY — PX: KNEE ARTHROSCOPY: SHX127

## 2020-06-25 SURGERY — ARTHROSCOPY, KNEE
Anesthesia: General | Site: Knee | Laterality: Right

## 2020-06-25 MED ORDER — ACETAMINOPHEN 10 MG/ML IV SOLN
INTRAVENOUS | Status: DC | PRN
  Administered 2020-06-25: 1000 mg via INTRAVENOUS

## 2020-06-25 MED ORDER — FENTANYL CITRATE (PF) 100 MCG/2ML IJ SOLN
INTRAMUSCULAR | Status: DC | PRN
Start: 2020-06-25 — End: 2020-06-25
  Administered 2020-06-25 (×4): 50 ug via INTRAVENOUS

## 2020-06-25 MED ORDER — EPINEPHRINE PF 1 MG/ML IJ SOLN
INTRAMUSCULAR | Status: AC
  Filled 2020-06-25: qty 1

## 2020-06-25 MED ORDER — CHLORHEXIDINE GLUCONATE 0.12 % MT SOLN
OROMUCOSAL | Status: AC
  Administered 2020-06-25: 15 mL via OROMUCOSAL
  Filled 2020-06-25: qty 15

## 2020-06-25 MED ORDER — CELECOXIB 200 MG PO CAPS: 400.0000 mg | ORAL_CAPSULE | Freq: Once | ORAL | Status: AC

## 2020-06-25 MED ORDER — PROPOFOL 10 MG/ML IV BOLUS
INTRAVENOUS | Status: AC
  Filled 2020-06-25: qty 20

## 2020-06-25 MED ORDER — MIDAZOLAM HCL 2 MG/2ML IJ SOLN
INTRAMUSCULAR | Status: AC
  Filled 2020-06-25: qty 2

## 2020-06-25 MED ORDER — LIDOCAINE HCL (CARDIAC) PF 100 MG/5ML IV SOSY
PREFILLED_SYRINGE | INTRAVENOUS | Status: DC | PRN
  Administered 2020-06-25: 80 mg via INTRAVENOUS

## 2020-06-25 MED ORDER — ORAL CARE MOUTH RINSE: 15.0000 mL | Freq: Once | OROMUCOSAL | Status: AC

## 2020-06-25 MED ORDER — HYDROCODONE-ACETAMINOPHEN 5-325 MG PO TABS: 1.0000 | ORAL_TABLET | ORAL | 0 refills | Status: DC | PRN

## 2020-06-25 MED ORDER — MORPHINE SULFATE 4 MG/ML IJ SOLN
INTRAMUSCULAR | Status: DC | PRN
Start: 2020-06-25 — End: 2020-06-25
  Administered 2020-06-25: 4 mg via SUBCUTANEOUS

## 2020-06-25 MED ORDER — LACTATED RINGERS IV SOLN: INTRAVENOUS | Status: DC

## 2020-06-25 MED ORDER — CELECOXIB 200 MG PO CAPS
ORAL_CAPSULE | ORAL | Status: AC
  Administered 2020-06-25: 400 mg via ORAL
  Filled 2020-06-25: qty 2

## 2020-06-25 MED ORDER — ONDANSETRON HCL 4 MG/2ML IJ SOLN
INTRAMUSCULAR | Status: DC | PRN
  Administered 2020-06-25: 4 mg via INTRAVENOUS

## 2020-06-25 MED ORDER — MIDAZOLAM HCL 2 MG/2ML IJ SOLN
INTRAMUSCULAR | Status: DC | PRN
  Administered 2020-06-25: 1 mg via INTRAVENOUS

## 2020-06-25 MED ORDER — MORPHINE SULFATE (PF) 4 MG/ML IV SOLN
INTRAVENOUS | Status: AC
  Filled 2020-06-25: qty 1

## 2020-06-25 MED ORDER — FENTANYL CITRATE (PF) 100 MCG/2ML IJ SOLN
INTRAMUSCULAR | Status: AC
  Filled 2020-06-25: qty 2

## 2020-06-25 MED ORDER — LIDOCAINE HCL (PF) 2 % IJ SOLN
INTRAMUSCULAR | Status: AC
  Filled 2020-06-25: qty 5

## 2020-06-25 MED ORDER — DEXAMETHASONE SODIUM PHOSPHATE 10 MG/ML IJ SOLN
INTRAMUSCULAR | Status: DC | PRN
  Administered 2020-06-25: 10 mg via INTRAVENOUS

## 2020-06-25 MED ORDER — CHLORHEXIDINE GLUCONATE 0.12 % MT SOLN: 15.0000 mL | Freq: Once | OROMUCOSAL | Status: AC

## 2020-06-25 MED ORDER — BUPIVACAINE-EPINEPHRINE 0.25% -1:200000 IJ SOLN
INTRAMUSCULAR | Status: DC | PRN
  Administered 2020-06-25: 5 mL

## 2020-06-25 MED ORDER — PROPOFOL 10 MG/ML IV BOLUS
INTRAVENOUS | Status: DC | PRN
  Administered 2020-06-25: 60 mg via INTRAVENOUS
  Administered 2020-06-25: 140 mg via INTRAVENOUS

## 2020-06-25 MED ORDER — ONDANSETRON HCL 4 MG/2ML IJ SOLN: 4.0000 mg | Freq: Once | INTRAMUSCULAR | Status: DC | PRN

## 2020-06-25 MED ORDER — BUPIVACAINE HCL (PF) 0.25 % IJ SOLN
INTRAMUSCULAR | Status: AC
  Filled 2020-06-25: qty 30

## 2020-06-25 MED ORDER — FENTANYL CITRATE (PF) 100 MCG/2ML IJ SOLN: 25.0000 ug | INTRAMUSCULAR | Status: DC | PRN

## 2020-06-25 MED ORDER — HYDROCODONE-ACETAMINOPHEN 5-325 MG PO TABS
ORAL_TABLET | ORAL | Status: AC
  Administered 2020-06-25: 1
  Filled 2020-06-25: qty 1

## 2020-06-25 MED ORDER — ACETAMINOPHEN 10 MG/ML IV SOLN
INTRAVENOUS | Status: AC
  Filled 2020-06-25: qty 100

## 2020-06-25 SURGICAL SUPPLY — 28 items
ADAPTER IRRIG TUBE 2 SPIKE SOL (ADAPTER) ×6 IMPLANT
BLADE SHAVER 4.5 DBL SERAT CV (CUTTER) IMPLANT
COVER WAND RF STERILE (DRAPES) ×3 IMPLANT
CUFF TOURN SGL QUICK 24 (TOURNIQUET CUFF) ×2
CUFF TOURN SGL QUICK 30 (TOURNIQUET CUFF)
CUFF TRNQT CYL 24X4X16.5-23 (TOURNIQUET CUFF) IMPLANT
CUFF TRNQT CYL 30X4X21-28X (TOURNIQUET CUFF) IMPLANT
DRSG DERMACEA 8X12 NADH (GAUZE/BANDAGES/DRESSINGS) ×3 IMPLANT
DURAPREP 26ML APPLICATOR (WOUND CARE) ×6 IMPLANT
GAUZE SPONGE 4X4 12PLY STRL (GAUZE/BANDAGES/DRESSINGS) ×3 IMPLANT
GLOVE BIOGEL M STRL SZ7.5 (GLOVE) ×3 IMPLANT
GLOVE INDICATOR 8.0 STRL GRN (GLOVE) ×3 IMPLANT
GOWN STRL REUS W/ TWL LRG LVL3 (GOWN DISPOSABLE) ×2 IMPLANT
GOWN STRL REUS W/TWL LRG LVL3 (GOWN DISPOSABLE) ×4
IV LACTATED RINGER IRRG 3000ML (IV SOLUTION) ×10
IV LR IRRIG 3000ML ARTHROMATIC (IV SOLUTION) ×6 IMPLANT
KIT TURNOVER KIT A (KITS) ×3 IMPLANT
MANIFOLD NEPTUNE II (INSTRUMENTS) ×3 IMPLANT
PACK KNEE ARTHRO (MISCELLANEOUS) ×3 IMPLANT
SET TUBE SUCT SHAVER OUTFL 24K (TUBING) ×3 IMPLANT
SET TUBE TIP INTRA-ARTICULAR (MISCELLANEOUS) ×3 IMPLANT
SOL PREP PVP 2OZ (MISCELLANEOUS) ×3
SOLUTION PREP PVP 2OZ (MISCELLANEOUS) ×1 IMPLANT
SUT ETHILON 3-0 FS-10 30 BLK (SUTURE) ×3
SUTURE EHLN 3-0 FS-10 30 BLK (SUTURE) ×1 IMPLANT
TUBING ARTHRO INFLOW-ONLY STRL (TUBING) ×3 IMPLANT
WAND HAND CNTRL MULTIVAC 50 (MISCELLANEOUS) ×3 IMPLANT
WRAP KNEE W/COLD PACKS 25.5X14 (SOFTGOODS) ×3 IMPLANT

## 2020-06-25 NOTE — Anesthesia Preprocedure Evaluation (Addendum)
Anesthesia Evaluation  Patient identified by MRN, date of birth, ID band Patient awake    Reviewed: Allergy & Precautions, NPO status , Patient's Chart, lab work & pertinent test results  History of Anesthesia Complications (+) Family history of anesthesia reactionNegative for: history of anesthetic complications  Airway Mallampati: III       Dental   Pulmonary neg sleep apnea, neg COPD, Not current smoker, former smoker,           Cardiovascular hypertension, Pt. on medications      Neuro/Psych    GI/Hepatic Neg liver ROS, GERD  Medicated and Controlled,  Endo/Other  neg diabetes  Renal/GU negative Renal ROS     Musculoskeletal   Abdominal   Peds  Hematology   Anesthesia Other Findings   Reproductive/Obstetrics                            Anesthesia Physical Anesthesia Plan  ASA: II  Anesthesia Plan: General   Post-op Pain Management:    Induction: Intravenous  PONV Risk Score and Plan: Dexamethasone, Ondansetron and Treatment may vary due to age or medical condition  Airway Management Planned: LMA and Oral ETT  Additional Equipment:   Intra-op Plan:   Post-operative Plan:   Informed Consent: I have reviewed the patients History and Physical, chart, labs and discussed the procedure including the risks, benefits and alternatives for the proposed anesthesia with the patient or authorized representative who has indicated his/her understanding and acceptance.       Plan Discussed with:   Anesthesia Plan Comments:         Anesthesia Quick Evaluation

## 2020-06-25 NOTE — Transfer of Care (Signed)
Immediate Anesthesia Transfer of Care Note  Patient: Sara Rhodes  Procedure(s) Performed: ARTHROSCOPY KNEE (Right Knee)  Patient Location: PACU  Anesthesia Type:General  Level of Consciousness: drowsy  Airway & Oxygen Therapy: Patient Spontanous Breathing and Patient connected to face mask oxygen  Post-op Assessment: Report given to RN and Post -op Vital signs reviewed and stable  Post vital signs: Reviewed and stable  Last Vitals:  Vitals Value Taken Time  BP 135/63 06/25/20 1811  Temp    Pulse 82 06/25/20 1812  Resp 22 06/25/20 1812  SpO2 100 % 06/25/20 1812  Vitals shown include unvalidated device data.  Last Pain:  Vitals:   06/25/20 1439  TempSrc: Tympanic  PainSc: 7          Complications: No complications documented.

## 2020-06-25 NOTE — H&P (Signed)
The patient has been re-examined, and the chart reviewed, and there have been no interval changes to the documented history and physical.    The risks, benefits, and alternatives have been discussed at length. The patient expressed understanding of the risks benefits and agreed with plans for surgical intervention.  Kashon Kraynak P. Laporsche Hoeger, Jr. M.D.    

## 2020-06-25 NOTE — Anesthesia Procedure Notes (Signed)
Procedure Name: LMA Insertion Date/Time: 06/25/2020 4:50 PM Performed by: Caryl Asp, CRNA Pre-anesthesia Checklist: Patient identified, Patient being monitored, Timeout performed, Emergency Drugs available and Suction available Patient Re-evaluated:Patient Re-evaluated prior to induction Oxygen Delivery Method: Circle system utilized Preoxygenation: Pre-oxygenation with 100% oxygen Induction Type: IV induction Ventilation: Mask ventilation without difficulty LMA: LMA inserted LMA Size: 4.0 Tube type: Oral Number of attempts: 1 Placement Confirmation: positive ETCO2 and breath sounds checked- equal and bilateral Tube secured with: Tape Dental Injury: Teeth and Oropharynx as per pre-operative assessment

## 2020-06-25 NOTE — Anesthesia Postprocedure Evaluation (Signed)
Anesthesia Post Note  Patient: Sara Rhodes  Procedure(s) Performed: ARTHROSCOPY KNEE (Right Knee)  Patient location during evaluation: PACU Anesthesia Type: General Level of consciousness: awake and alert Pain management: pain level controlled Vital Signs Assessment: post-procedure vital signs reviewed and stable Respiratory status: spontaneous breathing, nonlabored ventilation, respiratory function stable and patient connected to nasal cannula oxygen Cardiovascular status: blood pressure returned to baseline and stable Postop Assessment: no apparent nausea or vomiting Anesthetic complications: no   No complications documented.   Last Vitals:  Vitals:   06/25/20 1841 06/25/20 1856  BP: 128/78 123/60  Pulse: 75 75  Resp: 16 16  Temp:  37.4 C  SpO2: 96% 96%    Last Pain:  Vitals:   06/25/20 1841  TempSrc:   PainSc: 4                  Arita Miss

## 2020-06-25 NOTE — Discharge Instructions (Addendum)
AMBULATORY SURGERY  DISCHARGE INSTRUCTIONS   1) The drugs that you were given will stay in your system until tomorrow so for the next 24 hours you should not:  A) Drive an automobile B) Make any legal decisions C) Drink any alcoholic beverage   2) You may resume regular meals tomorrow.  Today it is better to start with liquids and gradually work up to solid foods.  You may eat anything you prefer, but it is better to start with liquids, then soup and crackers, and gradually work up to solid foods.   3) Please notify your doctor immediately if you have any unusual bleeding, trouble breathing, redness and pain at the surgery site, drainage, fever, or pain not relieved by medication.    4) Additional Instructions:        Please contact your physician with any problems or Same Day Surgery at [phone], Monday through Friday 6 am to 4 pm, or [place] at [facility] number at [phone]. Instructions after Knee Arthroscopy    James P. Holley Bouche., M.D.     Dept. of [facility]  [place], [place]  [address]   Phone: [phone]   Fax: [phone]   DIET: . Drink plenty of non-alcoholic fluids & begin a light diet. Marland Kitchen Resume your normal diet the day after surgery.  ACTIVITY:  . You may use crutches or a walker with weight-bearing as tolerated, unless instructed otherwise. . You may wean yourself off of the walker or crutches as tolerated.  . Begin doing gentle exercises. Exercising will reduce the pain and swelling, increase motion, and prevent muscle weakness.   . Avoid strenuous activities or athletics for a minimum of 4-6 weeks after arthroscopic surgery. . Do not drive or operate any equipment until instructed.  WOUND CARE:  . Place one to two pillows under the knee the first day or two when sitting or lying.  . Continue to use the ice packs periodically to reduce pain and swelling. . The small incisions  in your knee are closed with nylon stitches. The stitches will be removed in the office. . The bulky dressing may be removed on the second day after surgery. DO NOT TOUCH THE STITCHES. Put a Band-Aid over each stitch. Do NOT use any ointments or creams on the incisions.  . You may bathe or shower after the stitches are removed at the first office visit following surgery.  MEDICATIONS: . Dennis Bast may resume your regular medications. . Please take the pain medication as prescribed. . Do not take pain medication on an empty stomach. . Do not drive or drink alcoholic beverages when taking pain medications.  CALL THE OFFICE FOR: . Temperature above 101 degrees . Excessive bleeding or drainage on the dressing. . Excessive swelling, coldness, or paleness of the toes. . Persistent nausea and vomiting.  FOLLOW-UP:  . You should have an appointment to return to the office in 7-10 days after surgery.      [facility] Department Directory         [email or web address]       [email or web address]          Cardiology  Appointments: [place] [phone]  Endocrinology  Appointments: [place] [phone] [place] [phone]  Gastroenterology  Appointments: [place] [phone] [place] [phone]        General Surgery   Appointments: [facility]  Internal Medicine/Family Medicine  Appointments: [place] [phone]  [place] [phone] [place] - [phone]  Metabolic and [place] Loss Surgery  Appointments: [facility]        Neurology  Appointments: [place] [phone] [place] - [phone]  Neurosurgery  Appointments: [place]  Obstetrics & Gynecology  Appointments: [place] [phone] [place] - [phone]        Pediatrics  Appointments: [place] [phone] [place] - [phone]  Physiatry  Appointments: [place]  [phone]  Physical Therapy  Appointments: [place] [phone]        Podiatry  Appointments: [place] [phone] [place] - [phone]  Pulmonology  Appointments: [place]  Rheumatology  Appointments: [place] [phone]        [place] Location: [facility]  [address] [place], [place]  [address]  [place] Location: [facility] [address] [place], [place]  [address]  [place] Location: [facility] [address] [place],   [address]

## 2020-06-25 NOTE — Op Note (Signed)
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   ASSISTANT: none  ANESTHESIA: general  ESTIMATED BLOOD LOSS: Minimal  FLUIDS REPLACED: 600 mL of crystalloid  TOURNIQUET TIME: Not used  INDICATIONS FOR SURGERY: Sara Rhodes is a 64 y.o. year old female who has been seen for complaints of right knee pain. MRI demonstrated findings consistent with meniscal pathology. After discussion of the risks and benefits of surgical intervention, the patient expressed understanding of the risks benefits and agree with plans for right knee arthroscopy.   PROCEDURE IN DETAIL: The patient was brought into the operating room and, after adequate general anesthesia was achieved, a tourniquet was applied to the right thigh and the leg was placed in the leg holder. All bony prominences were well padded. The patient's right knee was cleaned and prepped with alcohol and Duraprep and draped in the usual sterile fashion. A "timeout" was performed as per usual protocol. The anticipated portal sites were injected with 0.25% Marcaine with epinephrine. An anterolateral incision was made and a cannula was inserted. A large effusion was evacuated and the knee was distended with fluid using the pump. The scope was advanced down the medial gutter into the medial compartment. Under visualization with the scope, an anteromedial portal was created and a hooked probe was inserted. The medial meniscus was visualized and probed.  There was a complex tear of the posterior horn of the medial  meniscus.  The tear was debrided using meniscal punches and a 4.5 mm incisor shaver.  Final contouring of performed using the 50 degree ArthroCare wand.  The remaining rim of meniscus was visualized and probed and felt to be stable.  The articular cartilage was visualized.  There were grade 3 changes of chondromalacia involving the medial femoral condyle and less so the medial tibial plateau.  These areas were debrided and contoured using the ArthroCare wand.  The scope was then advanced into the intercondylar notch. The anterior cruciate ligament was visualized and probed and felt to be intact. The scope was into the lateral compartment. The lateral meniscus was visualized and probed.  The lateral meniscus was intact without evidence of instability or tear.  The articular cartilage of the lateral compartment was visualized and noted to be in good condition.  Finally, the scope was advanced so as to visualize the patellofemoral articulation. Good patellar tracking was appreciated.  There were grade 2-3 changes of chondromalacia involving the intercondylar sulcus.  This area was debrided and contoured using the ArthroCare wand.  The knee was irrigated with copius amounts of fluid and suctioned dry. The anterolateral portal was re-approximated with #3-0 nylon. A combination of 0.25% Marcaine with epinephrine and 4 mg of Morphine were injected via the scope. The scope was removed and the anteromedial portal was re-approximated with #3-0 nylon. A sterile dressing was applied followed by application of an ice wrap.  The patient tolerated the procedure well and was transported to the PACU in stable condition.  Himmat Enberg P. Holley Bouche., M.D.

## 2020-06-26 ENCOUNTER — Encounter: Payer: Self-pay | Admitting: Orthopedic Surgery

## 2020-07-17 LAB — HM MAMMOGRAPHY

## 2020-08-12 DIAGNOSIS — M94261 Chondromalacia, right knee: Secondary | ICD-10-CM | POA: Insufficient documentation

## 2020-08-28 ENCOUNTER — Encounter: Payer: Self-pay | Admitting: Surgical

## 2020-08-28 ENCOUNTER — Telehealth: Payer: Self-pay

## 2020-08-28 ENCOUNTER — Ambulatory Visit (INDEPENDENT_AMBULATORY_CARE_PROVIDER_SITE_OTHER): Payer: Commercial Managed Care - PPO | Admitting: Obstetrics and Gynecology

## 2020-08-28 ENCOUNTER — Other Ambulatory Visit: Payer: Self-pay

## 2020-08-28 ENCOUNTER — Encounter: Payer: Self-pay | Admitting: Obstetrics and Gynecology

## 2020-08-28 ENCOUNTER — Other Ambulatory Visit (HOSPITAL_COMMUNITY)
Admission: RE | Admit: 2020-08-28 | Discharge: 2020-08-28 | Disposition: A | Discharge status: Home / Self Care | Payer: Commercial Managed Care - PPO | Source: Ambulatory Visit | Attending: Obstetrics and Gynecology | Admitting: Obstetrics and Gynecology

## 2020-08-28 VITALS — BP 130/86 | HR 56 | Ht 62.0 in | Wt 222.8 lb

## 2020-08-28 DIAGNOSIS — Z6841 Body Mass Index (BMI) 40.0 and over, adult: Secondary | ICD-10-CM | POA: Diagnosis not present

## 2020-08-28 DIAGNOSIS — Z01419 Encounter for gynecological examination (general) (routine) without abnormal findings: Secondary | ICD-10-CM

## 2020-08-28 DIAGNOSIS — Z124 Encounter for screening for malignant neoplasm of cervix: Secondary | ICD-10-CM

## 2020-08-28 MED ORDER — NYSTATIN-TRIAMCINOLONE 100000-0.1 UNIT/GM-% EX CREA: 1.0000 "application " | TOPICAL_CREAM | Freq: Every day | CUTANEOUS | 1 refills | Status: DC | PRN

## 2020-08-28 NOTE — Addendum Note (Signed)
Addended by: Durwin Glaze on: 08/28/2020 08:26 AM   Modules accepted: Orders

## 2020-08-28 NOTE — Progress Notes (Signed)
HPI:      [name] is a 64 y.o. [phone] who LMP was No LMP recorded. Patient has had a hysterectomy.  Subjective:   She presents today for her annual examination.  She has recently (August) had knee surgery and is still using a walker.  She is understandably disappointed with this result. She has no GYN complaints.  She is up-to-date on her mammography. Of significant note patient has had a hysterectomy but was a supracervical hysterectomy. She uses triamcinolone cream because of a "rash" that she gets underneath her abdominal panniculus.    Hx: The following portions of the patient's history were reviewed and updated as appropriate:             She  has a past medical history of Acid reflux, Behcet's disease ([place]), Family history of anesthesia complication, Fatty liver, Hyperlipemia, Hypertension, Obesity, Schatzki's ring, Tobacco use, Vaginal atrophy, and Vulvar itching. She does not have any pertinent problems on file. She  has a past surgical history that includes Thoracic discectomy (Right, 03/14/2013); Cesarean section; Back surgery; Colonoscopy with propofol (N/A, 12/31/2015); Esophagogastroduodenoscopy (egd) with propofol (N/A, 12/31/2015); Abdominal hysterectomy; and Knee arthroscopy (Right, 06/25/2020). Her family history includes AAA (abdominal aortic aneurysm) in her father; Crohn's disease in her daughter; Hypertension in her father; Kidney failure in her mother; Leukemia in her mother. She  reports that she quit smoking about 3 years ago. Her smoking use included cigarettes. She has a 5.00 pack-year smoking history. She has never used smokeless tobacco. She reports that she does not drink alcohol and does not use drugs. She has a current medication list which includes the following prescription(s): acetaminophen, amlodipine-benazepril, carboxymethylcellul-glycerin, eucrisa, hydrochlorothiazide, naproxen sodium, simvastatin, valacyclovir, clobetasol ointment, levocetirizine,  nystatin-triamcinolone, and [DISCONTINUED] omeprazole. She is allergic to ibuprofen, latex, and penicillins.       Review of Systems:  Review of Systems  Constitutional: Denied constitutional symptoms, night sweats, recent illness, fatigue, fever, insomnia and weight loss.  Eyes: Denied eye symptoms, eye pain, photophobia, vision change and visual disturbance.  Ears/Nose/Throat/Neck: Denied ear, nose, throat or neck symptoms, hearing loss, nasal discharge, sinus congestion and sore throat.  Cardiovascular: Denied cardiovascular symptoms, arrhythmia, chest pain/pressure, edema, exercise intolerance, orthopnea and palpitations.  Respiratory: Denied pulmonary symptoms, asthma, pleuritic pain, productive sputum, cough, dyspnea and wheezing.  Gastrointestinal: Denied, gastro-esophageal reflux, melena, nausea and vomiting.  Genitourinary: Denied genitourinary symptoms including symptomatic vaginal discharge, pelvic relaxation issues, and urinary complaints.  Musculoskeletal: Denied musculoskeletal symptoms, stiffness, swelling, muscle weakness and myalgia.  Dermatologic: Denied dermatology symptoms, rash and scar.  Neurologic: Denied neurology symptoms, dizziness, headache, neck pain and syncope.  Psychiatric: Denied psychiatric symptoms, anxiety and depression.  Endocrine: Denied endocrine symptoms including hot flashes and night sweats.   Meds:   Current Outpatient Medications on File Prior to Visit  Medication Sig Dispense Refill  . acetaminophen (TYLENOL) 500 MG tablet Take 500-1,000 mg by mouth every 6 (six) hours as needed (for pain.).    Marland Kitchen amLODipine-benazepril (LOTREL) 5-20 MG per capsule Take 1 capsule by mouth at bedtime.    . carboxymethylcellul-glycerin (REFRESH OPTIVE) 0.5-0.9 % ophthalmic solution Place 1 drop into both eyes 3 (three) times daily as needed for dry eyes.    Stasia Cavalier (EUCRISA) 2 % OINT Apply 2 % topically daily as needed.    . hydrochlorothiazide (HYDRODIURIL)  25 MG tablet Take 25 mg by mouth daily.    . naproxen sodium (ALEVE) 220 MG tablet Take 220-440 mg by mouth 2 (  two) times daily as needed (pain.).     Marland Kitchen simvastatin (ZOCOR) 20 MG tablet Take 20 mg by mouth at bedtime.    . valACYclovir (VALTREX) 1000 MG tablet Take 1,000 mg by mouth See admin instructions. At first sign of outbreak, take 1 tablet (1,000 mg total) by mouth twice daily for 2-5 days (less if lesion resolves).    . clobetasol ointment (TEMOVATE) 0.05 % Apply topically.    Marland Kitchen levocetirizine (XYZAL ALLERGY 24HR) 5 MG tablet Take 5 mg by mouth daily as needed for allergies. (Patient not taking: Reported on 08/28/2020)    . [DISCONTINUED] omeprazole (PRILOSEC) 20 MG capsule Take 20 mg by mouth daily.     No current facility-administered medications on file prior to visit.          Objective:     Vitals:   08/28/20 0735  BP: 130/86  Pulse: (!) 56    Filed Weights   08/28/20 0735  Weight: 222 lb 12.8 oz (101.1 kg)              Physical examination General NAD, Conversant  HEENT Atraumatic; Op clear with mmm.  Normo-cephalic. Pupils reactive. Anicteric sclerae  Thyroid/Neck Smooth without nodularity or enlargement. Normal ROM.  Neck Supple.  Skin No rashes, lesions or ulceration. Normal palpated skin turgor. No nodularity.  Breasts: No masses or discharge.  Symmetric.  No axillary adenopathy.  Lungs: Clear to auscultation.No rales or wheezes. Normal Respiratory effort, no retractions.  Heart: NSR.  No murmurs or rubs appreciated. No periferal edema  Abdomen: Soft.  Non-tender.  No masses.  No HSM. No hernia  Extremities: Moves all appropriately.  Normal ROM for age. No lymphadenopathy.  Neuro: Oriented to PPT.  Normal mood. Normal affect.     Pelvic:   Vulva: Normal appearance.  No lesions.  Vagina: No lesions or abnormalities noted.  Support: Normal pelvic support.  Urethra No masses tenderness or scarring.  Meatus Normal size without lesions or prolapse.  Cervix:  Normal appearance.  No lesions.  Anus: Normal exam.  No lesions.  Perineum: Normal exam.  No lesions.        Bimanual   Uterus:  Surgically absent  Adnexae: No masses.  Non-tender to palpation.  Cul-de-sac: Negative for abnormality.   Exam limited by patient body habitus   Assessment:    [id number] Patient Active Problem List   Diagnosis Date Noted  . Borderline diabetes mellitus 06/24/2018  . Status post hysterectomy with oophorectomy 01/21/2017  . Fatty liver 10/24/2015  . GERD (gastroesophageal reflux disease) 03/10/2014  . HTN (hypertension) 03/10/2014  . Obesity 03/10/2014  . Hypercholesterolemia 03/10/2014  . Varicose veins of bilateral lower extremities with other complications 52/77/8242  . HNP (herniated nucleus pulposus), thoracic 03/14/2013     1. Well woman exam with routine gynecological exam   2. Class 3 severe obesity due to excess calories without serious comorbidity with body mass index (BMI) of 40.0 to 44.9 in adult [facility])        Plan:            1.  Basic Screening Recommendations The basic screening recommendations for asymptomatic women were discussed with the patient during her visit.  The age-appropriate recommendations were discussed with her and the rational for the tests reviewed.  When I am informed by the patient that another primary care physician has previously obtained the age-appropriate tests and they are up-to-date, only outstanding tests are ordered and referrals given as necessary.  Abnormal results  of tests will be discussed with her when all of her results are completed.  Routine preventative health maintenance measures emphasized: Exercise/Diet/Weight control, Tobacco Warnings, Alcohol/Substance use risks and Stress Management Pap performed  Orders No orders of the defined types were placed in this encounter.    Meds ordered this encounter  Medications  . nystatin-triamcinolone (MYCOLOG II) cream    Sig: Apply 1 application topically  daily as needed.    Dispense:  60 g    Refill:  1            F/U  Return in about 1 year (around 08/28/2021) for Annual Physical.  Finis Bud, M.D. 08/28/2020 8:10 AM

## 2020-08-28 NOTE — Telephone Encounter (Signed)
Pt was checking out and she wanted to know if her last mammogram was sent over from [place]. The pt was told the process when a fax comes in. The pt is requesting a call about the results. The pt was told to please allow 24-48 hours for a reply,. Please advise

## 2020-08-29 ENCOUNTER — Telehealth: Payer: Self-pay

## 2020-08-29 LAB — CYTOLOGY - PAP
Comment: NEGATIVE
Diagnosis: NEGATIVE
High risk HPV: NEGATIVE

## 2020-08-29 NOTE — Telephone Encounter (Signed)
mychart message sent to patient

## 2020-08-29 NOTE — Telephone Encounter (Signed)
[place] called in and stated that she are needing more information about the cream for this pt. Please advise

## 2020-08-29 NOTE — Telephone Encounter (Signed)
Spoke to pharmacy and gave them the info on the prescription.

## 2020-08-29 NOTE — Telephone Encounter (Addendum)
LM for patient that we did get mammogram results and they was good. Results show that results was given to the patient verbally the day of mammogram as well.

## 2020-08-31 ENCOUNTER — Encounter: Payer: Self-pay | Admitting: Surgical

## 2020-11-30 ENCOUNTER — Encounter
Admission: RE | Admit: 2020-11-30 | Discharge: 2020-11-30 | Disposition: A | Discharge status: Home / Self Care | Payer: Commercial Managed Care - PPO | Source: Ambulatory Visit | Attending: Obstetrics and Gynecology | Admitting: Obstetrics and Gynecology

## 2020-11-30 ENCOUNTER — Other Ambulatory Visit: Payer: Self-pay

## 2020-11-30 DIAGNOSIS — Z01812 Encounter for preprocedural laboratory examination: Secondary | ICD-10-CM | POA: Diagnosis present

## 2020-11-30 LAB — PROTIME-INR
INR: 1 (ref 0.8–1.2)
Prothrombin Time: 12.3 seconds (ref 11.4–15.2)

## 2020-11-30 LAB — CBC
HCT: 41.2 % (ref 36.0–46.0)
Hemoglobin: 13.5 g/dL (ref 12.0–15.0)
MCH: 30.1 pg (ref 26.0–34.0)
MCHC: 32.8 g/dL (ref 30.0–36.0)
MCV: 91.8 fL (ref 80.0–100.0)
Platelets: 237 10*3/uL (ref 150–400)
RBC: 4.49 MIL/uL (ref 3.87–5.11)
RDW: 12.8 % (ref 11.5–15.5)
WBC: 6.7 10*3/uL (ref 4.0–10.5)
nRBC: 0 % (ref 0.0–0.2)

## 2020-11-30 LAB — COMPREHENSIVE METABOLIC PANEL
ALT: 30 U/L (ref 0–44)
AST: 29 U/L (ref 15–41)
Albumin: 4.5 g/dL (ref 3.5–5.0)
Alkaline Phosphatase: 62 U/L (ref 38–126)
Anion gap: 11 (ref 5–15)
BUN: 12 mg/dL (ref 8–23)
CO2: 27 mmol/L (ref 22–32)
Calcium: 9.5 mg/dL (ref 8.9–10.3)
Chloride: 103 mmol/L (ref 98–111)
Creatinine, Ser: 0.62 mg/dL (ref 0.44–1.00)
GFR, Estimated: >60 mL/min (ref >60)
Glucose, Bld: 75 mg/dL (ref 70–99)
Potassium: 3.8 mmol/L (ref 3.5–5.1)
Sodium: 141 mmol/L (ref 135–145)
Total Bilirubin: 0.6 mg/dL (ref 0.3–1.2)
Total Protein: 7.9 g/dL (ref 6.5–8.1)

## 2020-11-30 LAB — URINALYSIS, ROUTINE W REFLEX MICROSCOPIC
Bacteria, UA: NONE SEEN
Bilirubin Urine: NEGATIVE
Glucose, UA: NEGATIVE mg/dL
Hgb urine dipstick: NEGATIVE
Ketones, ur: NEGATIVE mg/dL
Leukocytes,Ua: NEGATIVE
Nitrite: NEGATIVE
Protein, ur: NEGATIVE mg/dL
Specific Gravity, Urine: 1.002 — ABNORMAL LOW (ref 1.005–1.030)
WBC, UA: NONE SEEN WBC/hpf (ref 0–5)
pH: 5 (ref 5.0–8.0)

## 2020-11-30 LAB — SURGICAL PCR SCREEN
MRSA, PCR: NEGATIVE
Staphylococcus aureus: NEGATIVE

## 2020-11-30 LAB — TYPE AND SCREEN
ABO/RH(D): O NEG
Antibody Screen: NEGATIVE

## 2020-11-30 LAB — C-REACTIVE PROTEIN: CRP: 0.6 mg/dL (ref <1.0)

## 2020-11-30 LAB — APTT: aPTT: 27 seconds (ref 24–36)

## 2020-11-30 LAB — SEDIMENTATION RATE: Sed Rate: 8 mm/hr (ref 0–30)

## 2020-11-30 NOTE — Patient Instructions (Signed)
Your procedure is scheduled on:12/05/20  Report to [place]. To find out your arrival time please call [phone] between 1PM - 3PM on 12/04/20.  Remember: Instructions that are not followed completely may result in serious medical risk, up to and including death, or upon the discretion of your surgeon and anesthesiologist your surgery may need to be rescheduled.     _X__ 1. Do not eat food after midnight the night before your procedure.                 No gum chewing or hard candies. You may drink clear liquids up to 2 hours                 before you are scheduled to arrive for your surgery- DO not drink clear                 liquids within 2 hours of the start of your surgery.                 Clear Liquids include:  water, apple juice without pulp, clear carbohydrate                 drink such as Clearfast or Gatorade, Black Coffee or Tea (Do not add                 anything to coffee or tea). Diabetics water only  __X__2.  On the morning of surgery brush your teeth with toothpaste and water, you                 may rinse your mouth with mouthwash if you wish.  Do not swallow any              toothpaste of mouthwash.     _X__ 3.  No Alcohol for 24 hours before or after surgery.   _X__ 4.  Do Not Smoke or use e-cigarettes For 24 Hours Prior to Your Surgery.                 Do not use any chewable tobacco products for at least 6 hours prior to                 surgery.  ____  5.  Bring all medications with you on the day of surgery if instructed.   __X__  6.  Notify your doctor if there is any change in your medical condition      (cold, fever, infections).     Do not wear jewelry, make-up, hairpins, clips or nail polish. Do not wear lotions, powders, or perfumes.  Do not shave 48 hours prior to surgery. Men may shave face and neck. Do not bring valuables to the hospital.    [facility] is not responsible for any belongings or  valuables.  Contacts, dentures/partials or body piercings may not be worn into surgery. Bring a case for your contacts, glasses or hearing aids, a denture cup will be supplied. Leave your suitcase in the car. After surgery it may be brought to your room. For patients admitted to the hospital, discharge time is determined by your treatment team.   Patients discharged the day of surgery will not be allowed to drive home.   Please read over the following fact sheets that you were given:   MRSA Information  __X__ Take these medicines the morning of surgery with A SIP OF WATER:  1. omeprazole (PRILOSEC) 20 MG capsule  2.   3.   4.  5.  6.  ____ Fleet Enema (as directed)   __X__ Use CHG Soap/SAGE wipes as directed  ____ Use inhalers on the day of surgery  ____ Stop metformin/Janumet/Farxiga 2 days prior to surgery    ____ Take 1/2 of usual insulin dose the night before surgery. No insulin the morning          of surgery.   ____ Stop Blood Thinners Coumadin/Plavix/Xarelto/Pleta/Pradaxa/Eliquis/Effient/Aspirin  on   Or contact your Surgeon, Cardiologist or Medical Doctor regarding  ability to stop your blood thinners  __X__ Stop Anti-inflammatories 7 days before surgery such as Advil, Ibuprofen, Motrin,  BC or Goodies Powder, Naprosyn, Naproxen, Aleve, Aspirin    __X__ Stop all herbal supplements, fish oil or vitamin E until after surgery. Stop Tumeric and Aleve today 11/30/20   ____ Bring C-Pap to the hospital.

## 2020-12-02 ENCOUNTER — Encounter: Payer: Self-pay | Admitting: Orthopedic Surgery

## 2020-12-02 LAB — URINE CULTURE
Culture: NO GROWTH
Special Requests: NORMAL

## 2020-12-03 ENCOUNTER — Other Ambulatory Visit: Payer: Self-pay

## 2020-12-03 ENCOUNTER — Other Ambulatory Visit
Admission: RE | Admit: 2020-12-03 | Discharge: 2020-12-03 | Disposition: A | Discharge status: Home / Self Care | Payer: Commercial Managed Care - PPO | Source: Ambulatory Visit | Attending: Orthopedic Surgery | Admitting: Orthopedic Surgery

## 2020-12-03 DIAGNOSIS — Z20822 Contact with and (suspected) exposure to covid-19: Secondary | ICD-10-CM | POA: Insufficient documentation

## 2020-12-03 DIAGNOSIS — Z01812 Encounter for preprocedural laboratory examination: Secondary | ICD-10-CM | POA: Diagnosis present

## 2020-12-03 LAB — SARS CORONAVIRUS 2 (TAT 6-24 HRS): SARS Coronavirus 2: NEGATIVE

## 2020-12-04 MED ORDER — DEXAMETHASONE SODIUM PHOSPHATE 10 MG/ML IJ SOLN: 8.0000 mg | Freq: Once | INTRAMUSCULAR | Status: AC

## 2020-12-04 MED ORDER — CELECOXIB 200 MG PO CAPS: 400.0000 mg | ORAL_CAPSULE | Freq: Once | ORAL | Status: AC

## 2020-12-04 MED ORDER — ORAL CARE MOUTH RINSE
15.0000 mL | Freq: Once | OROMUCOSAL | Status: AC
  Administered 2020-12-05: 15 mL via OROMUCOSAL

## 2020-12-04 MED ORDER — LACTATED RINGERS IV SOLN: INTRAVENOUS | Status: DC

## 2020-12-04 MED ORDER — CHLORHEXIDINE GLUCONATE 4 % EX LIQD: 60.0000 mL | Freq: Once | CUTANEOUS | Status: DC

## 2020-12-04 MED ORDER — CEFAZOLIN SODIUM-DEXTROSE 2-4 GM/100ML-% IV SOLN
2.0000 g | INTRAVENOUS | Status: AC
  Administered 2020-12-05: 2 g via INTRAVENOUS

## 2020-12-04 MED ORDER — GABAPENTIN 300 MG PO CAPS: 300.0000 mg | ORAL_CAPSULE | Freq: Once | ORAL | Status: AC

## 2020-12-04 MED ORDER — TRANEXAMIC ACID-NACL 1000-0.7 MG/100ML-% IV SOLN
1000.0000 mg | INTRAVENOUS | Status: AC
  Administered 2020-12-05: 1000 mg via INTRAVENOUS

## 2020-12-04 MED ORDER — CHLORHEXIDINE GLUCONATE 0.12 % MT SOLN: 15.0000 mL | Freq: Once | OROMUCOSAL | Status: AC

## 2020-12-04 NOTE — H&P (Signed)
ORTHOPAEDIC HISTORY & PHYSICAL Sara Rhodes, [place] - 12/03/2020 2:00 PM EST Formatting of this note is different from the original. [place] [facility] Chief Complaint:   Chief Complaint  Patient presents with  . Pre-op Exam  Right knee degenerative arthrosis, H&P for right total knee arthroplasty 12/05/20   History of Present Illness:   Sara Rhodes is a 65 y.o. female that presents to clinic today for her preoperative history and evaluation. Patient presents unaccompanied. The patient is scheduled to undergo a right total knee arthroplasty on 12/05/20 by Dr. Marry Guan. Her pain began around 7 months ago. Patient previously underwent a right knee arthroscopy with partial medial meniscectomy and chondroplasty by Dr. Marry Guan on 06/25/2020, but continued to have pain following surgery. The pain is located along the medial aspect of the knee. She describes her pain as worse with weightbearing. She reports associated swelling with some giving way of the knee. She denies associated numbness or tingling, denies locking of the knee.   The patient's symptoms have progressed to the point that they decrease her quality of life. The patient has previously undergone conservative treatment including NSAIDS and injections to the knee without adequate control of her symptoms.  Patient does report an allergy to penicillin, she states it has been years but her reaction was hives. Denies history of blood clots, significant cardiac history.   Past Medical, Surgical, Family, Social History, Allergies, Medications:   Past Medical History:  Past Medical History:  Diagnosis Date  . Asthma without status asthmaticus, unspecified  when a child  . 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 GERD (gastroesophageal reflux disease)  . Hyperlipidemia  . Hypertension  . Obesity  . Phlebitis  . Tobacco abuse 03/10/2014   Past Surgical History:  Past Surgical History:   Procedure Laterality Date  . Right knee arthroscopy, partial medial meniscectomy, and chondroplasty 06/25/2020  Dr Marry Guan  . APPENDECTOMY  . COLONOSCOPY 09/09/2010  Adenomatous Polyp: CBF 09/2015  . COLONOSCOPY 12/31/2015  PH Adenomatous Polyp: [facility]BF 12/2020  . EGD 12/31/2015  No repeat per RTE  . ESOPHAGEAL DILATION 11/2016  . HYSTERECTOMY  . LAMINECTOMY LUMBAR SPINE   Current Medications:  Current Outpatient Medications  Medication Sig Dispense Refill  . amLODIPine-benazepril (LOTREL) 5-20 mg capsule TAKE ONE CAPSULE BY MOUTH ONCE DAILY 90 capsule 1  . carboxymethylcellulose-glycerin (REFRESH OPTIVE) 0.5-0.9 % ophthalmic solution Place 1 drop into both eyes as needed  . celecoxib (CELEBREX) 200 MG capsule Take 1 capsule (200 mg total) by mouth 2 (two) times daily 60 capsule 3  . clobetasoL (TEMOVATE) 0.05 % ointment Apply 1 Application topically as directed  . crisaborole (EUCRISA) 2 % Oint Apply 2 % topically as directed  . hydroCHLOROthiazide (HYDRODIURIL) 25 MG tablet TAKE ONE TABLET BY MOUTH ONCE DAILY 30 tablet 0  . levocetirizine (XYZAL) 5 MG tablet Take 5 mg by mouth every evening  . nystatin-triamcinolone cream Apply topically as needed  . omeprazole (PRILOSEC) 20 MG DR capsule TAKE ONE CAPSULE BY MOUTH ONCE DAILY 30 capsule 1  . simvastatin (ZOCOR) 20 MG tablet TAKE ONE TABLET BY MOUTH AT BEDTIME 90 tablet 1  . turmeric/turmeric ext/pepr ext (TURMERIC-TURMERIC EXT-PEPPER) 500-3 mg Cap Take 500 mg by mouth once daily  . valACYclovir (VALTREX) 1000 MG tablet At first sign of outbreak, take 1 tablet (1,000 mg total) by mouth twice daily for 2-5 days (less if lesion resolves). 30 tablet 0  . acetaminophen (TYLENOL) 500 MG tablet Take  1,000 mg by mouth every 6 (six) hours as needed (Patient not taking: Reported on 12/03/2020 )  . cholecalciferol (CHOLECALCIFEROL) 1,000 unit tablet Take 1 tablet (1,000 Units total) by mouth once daily. (Patient not taking: Reported on 12/03/2020 ) 90  tablet 1   No current facility-administered medications for this visit.   Allergies:  Allergies  Allergen Reactions  . Penicillins Hives  . Ibuprofen Abdominal Pain  Upsets stomach  . Latex Rash   Social History:  Social History   Socioeconomic History  . Marital status: Married  Spouse name: Francee Piccolo  . Number of children: 3  . Years of education: 66  . Highest education level: Not on file  Occupational History  . Occupation: Full-timeNurse, learning disability  Tobacco Use  . Smoking status: Former Smoker  Packs/day: 0.50  Years: 10.00  Pack years: 5.00  Types: Cigarettes  Quit date: 2018  Years since quitting: 4.0  . Smokeless tobacco: Never Used  Vaping Use  . Vaping Use: Never used  Substance and Sexual Activity  . Alcohol use: No  Alcohol/week: 0.0 standard drinks  . Drug use: No  . Sexual activity: Defer  Partners: Male  Birth control/protection: Post-menopausal  Other Topics Concern  . Not on file  Social History Narrative  Married, 3 kids that are grown  Currently looking for a job  Religious Affiliation: Christian/[employer]   Social Determinants of Health   Financial Resource Strain: Not on file  Food Insecurity: Not on file  Transportation Needs: Not on file  Physical Activity: Not on file  Stress: Not on file  Social Connections: Not on file  Housing Stability: Not on file   Family History:  Family History  Problem Relation Age of Onset  . Leukemia Mother  . High blood pressure (Hypertension) Mother  . Aneurysm Father  . High blood pressure (Hypertension) Father  . Stroke Brother  . High blood pressure (Hypertension) Brother  . High blood pressure (Hypertension) Sister   Review of Systems:   A 10+ ROS was performed, reviewed, and the pertinent orthopaedic findings are documented in the HPI.   Physical Examination:   BP 128/72  Temp 36.4 C (97.5 F)  Ht 157.5 cm (5\' 2" )  Wt (!) 101.1 kg (222 lb 12.8 oz)  BMI 40.75 kg/m   Patient is a  well-developed, well-nourished female in no acute distress. Patient has normal mood and affect. Patient is alert and oriented to person, place, and time.   Patient ambulates with use of a walker.   HEENT: Atraumatic, normocephalic. Pupils equal and reactive to light. Extraocular motion intact. Noninjected sclera.  Cardiovascular: Regular rate and rhythm, with no murmurs, rubs, or gallops. Distal pulses palpable. No bruits.  Respiratory: Lungs clear to auscultation bilaterally.   Right Knee: Soft tissue swelling: mild Effusion: minimal Erythema: none Crepitance: mild Tenderness: medial Alignment: relative varus Mediolateral laxity: medial pseudolaxity Posterior sag: negative Patellar tracking: Good tracking without evidence of subluxation or tilt Atrophy: No significant atrophy.  Quadriceps tone was fair to good. Range of motion: 0/3/113 degrees  Sensation intact over the saphenous, lateral sural cutaneous, superficial fibular, and deep fibular nerve distributions.  Tests Performed/Reviewed:  X-rays  No new radiographs were obtained today. Previous radiographs were reviewed of the right knee and revealed loss of medial compartment joint space.   Impression:   ICD-10-CM  1. Primary osteoarthritis of right knee M17.11   Plan:   The patient has end-stage degenerative changes of the right knee. It was explained to the  patient that the condition is progressive in nature. Having failed conservative treatment, the patient has elected to proceed with a total joint arthroplasty. The patient will undergo a total joint arthroplasty with Dr. Marry Guan. The risks of surgery, including blood clot and infection, were discussed with the patient. Measures to reduce these risks, including the use of anticoagulation, perioperative antibiotics, and early ambulation were discussed. The importance of postoperative physical therapy was discussed with the patient. The patient elects to proceed with  surgery. The patient is instructed to stop all blood thinners prior to surgery. The patient is instructed to call the hospital the day before surgery to learn of the proper arrival time.   Contact our office with any questions or concerns. Follow up as indicated, or sooner should any new problems arise, if conditions worsen, or if they are otherwise concerned.   Sara Fudge, PA-C [facility] [address], [place] [address] Phone: [phone]  This note was generated in part with voice recognition software and I apologize for any typographical errors that were not detected and corrected.   Electronically signed by Sara Fudge, PA at 12/03/2020 2:44 PM EST

## 2020-12-05 ENCOUNTER — Ambulatory Visit: Payer: Commercial Managed Care - PPO | Admitting: Anesthesiology

## 2020-12-05 ENCOUNTER — Encounter
Admission: RE | Disposition: A | Discharge status: Home Health | Payer: Self-pay | Source: Home / Self Care | Attending: Orthopedic Surgery

## 2020-12-05 ENCOUNTER — Ambulatory Visit
Admission: RE | Admit: 2020-12-05 | Discharge: 2020-12-05 | Disposition: A | Discharge status: Home Health | Payer: Commercial Managed Care - PPO | Attending: Orthopedic Surgery | Admitting: Orthopedic Surgery

## 2020-12-05 ENCOUNTER — Ambulatory Visit: Payer: Commercial Managed Care - PPO

## 2020-12-05 ENCOUNTER — Other Ambulatory Visit: Payer: Self-pay

## 2020-12-05 ENCOUNTER — Encounter: Payer: Self-pay | Admitting: Orthopedic Surgery

## 2020-12-05 DIAGNOSIS — Z96659 Presence of unspecified artificial knee joint: Secondary | ICD-10-CM

## 2020-12-05 DIAGNOSIS — Z886 Allergy status to analgesic agent status: Secondary | ICD-10-CM | POA: Diagnosis not present

## 2020-12-05 DIAGNOSIS — Z6841 Body Mass Index (BMI) 40.0 and over, adult: Secondary | ICD-10-CM | POA: Insufficient documentation

## 2020-12-05 DIAGNOSIS — Z79899 Other long term (current) drug therapy: Secondary | ICD-10-CM | POA: Diagnosis not present

## 2020-12-05 DIAGNOSIS — Z87891 Personal history of nicotine dependence: Secondary | ICD-10-CM | POA: Insufficient documentation

## 2020-12-05 DIAGNOSIS — Z91040 Latex allergy status: Secondary | ICD-10-CM | POA: Insufficient documentation

## 2020-12-05 DIAGNOSIS — M1711 Unilateral primary osteoarthritis, right knee: Secondary | ICD-10-CM | POA: Insufficient documentation

## 2020-12-05 DIAGNOSIS — E669 Obesity, unspecified: Secondary | ICD-10-CM | POA: Diagnosis not present

## 2020-12-05 DIAGNOSIS — Z880 Allergy status to penicillin: Secondary | ICD-10-CM | POA: Insufficient documentation

## 2020-12-05 DIAGNOSIS — Z96651 Presence of right artificial knee joint: Secondary | ICD-10-CM

## 2020-12-05 HISTORY — PX: KNEE ARTHROPLASTY: SHX992

## 2020-12-05 LAB — IGE: IgE (Immunoglobulin E), Serum: 24 IU/mL (ref 6–495)

## 2020-12-05 LAB — ABO/RH: ABO/RH(D): O NEG

## 2020-12-05 SURGERY — ARTHROPLASTY, KNEE, TOTAL, USING IMAGELESS COMPUTER-ASSISTED NAVIGATION
Anesthesia: Spinal | Site: Knee | Laterality: Right

## 2020-12-05 MED ORDER — BUPIVACAINE HCL (PF) 0.5 % IJ SOLN
INTRAMUSCULAR | Status: DC | PRN
  Administered 2020-12-05: 3 mL via INTRATHECAL

## 2020-12-05 MED ORDER — ONDANSETRON HCL 4 MG/2ML IJ SOLN: 4.0000 mg | Freq: Four times a day (QID) | INTRAMUSCULAR | Status: DC | PRN

## 2020-12-05 MED ORDER — LACTATED RINGERS IV BOLUS
250.0000 mL | Freq: Once | INTRAVENOUS | Status: AC
  Administered 2020-12-05: 250 mL via INTRAVENOUS

## 2020-12-05 MED ORDER — TRANEXAMIC ACID-NACL 1000-0.7 MG/100ML-% IV SOLN
INTRAVENOUS | Status: AC
  Administered 2020-12-05: 1000 mg via INTRAVENOUS
  Filled 2020-12-05: qty 100

## 2020-12-05 MED ORDER — CEFAZOLIN SODIUM-DEXTROSE 2-4 GM/100ML-% IV SOLN
INTRAVENOUS | Status: AC
  Filled 2020-12-05: qty 100

## 2020-12-05 MED ORDER — BISACODYL 10 MG RE SUPP
10.0000 mg | Freq: Every day | RECTAL | Status: DC | PRN
  Filled 2020-12-05: qty 1

## 2020-12-05 MED ORDER — PANTOPRAZOLE SODIUM 40 MG PO TBEC
40.0000 mg | DELAYED_RELEASE_TABLET | Freq: Two times a day (BID) | ORAL | Status: DC
  Filled 2020-12-05: qty 1

## 2020-12-05 MED ORDER — FERROUS SULFATE 325 (65 FE) MG PO TABS
325.0000 mg | ORAL_TABLET | Freq: Two times a day (BID) | ORAL | Status: DC
  Filled 2020-12-05: qty 1

## 2020-12-05 MED ORDER — MIDAZOLAM HCL 2 MG/2ML IJ SOLN
INTRAMUSCULAR | Status: AC
  Filled 2020-12-05: qty 2

## 2020-12-05 MED ORDER — MAGNESIUM HYDROXIDE 400 MG/5ML PO SUSP: 30.0000 mL | Freq: Every day | ORAL | Status: DC

## 2020-12-05 MED ORDER — ACETAMINOPHEN 10 MG/ML IV SOLN
INTRAVENOUS | Status: DC | PRN
  Administered 2020-12-05: 1000 mg via INTRAVENOUS

## 2020-12-05 MED ORDER — ACETAMINOPHEN 10 MG/ML IV SOLN
INTRAVENOUS | Status: AC
  Filled 2020-12-05: qty 100

## 2020-12-05 MED ORDER — SODIUM CHLORIDE 0.9 % IV SOLN
INTRAVENOUS | Status: DC | PRN
  Administered 2020-12-05: 60 mL

## 2020-12-05 MED ORDER — ONDANSETRON HCL 4 MG/2ML IJ SOLN: 4.0000 mg | Freq: Once | INTRAMUSCULAR | Status: AC | PRN

## 2020-12-05 MED ORDER — PHENOL 1.4 % MT LIQD
1.0000 | OROMUCOSAL | Status: DC | PRN
  Filled 2020-12-05: qty 177

## 2020-12-05 MED ORDER — CELECOXIB 200 MG PO CAPS: 200.0000 mg | ORAL_CAPSULE | Freq: Two times a day (BID) | ORAL | 2 refills | Status: DC

## 2020-12-05 MED ORDER — ONDANSETRON HCL 4 MG/2ML IJ SOLN
INTRAMUSCULAR | Status: AC
  Administered 2020-12-05: 4 mg via INTRAVENOUS
  Filled 2020-12-05: qty 2

## 2020-12-05 MED ORDER — PROPOFOL 10 MG/ML IV BOLUS
INTRAVENOUS | Status: AC
  Filled 2020-12-05: qty 20

## 2020-12-05 MED ORDER — LIDOCAINE HCL (CARDIAC) PF 100 MG/5ML IV SOSY
PREFILLED_SYRINGE | INTRAVENOUS | Status: DC | PRN
  Administered 2020-12-05: 50 mg via INTRAVENOUS

## 2020-12-05 MED ORDER — DIPHENHYDRAMINE HCL 12.5 MG/5ML PO ELIX
12.5000 mg | ORAL_SOLUTION | ORAL | Status: DC | PRN
  Filled 2020-12-05: qty 10

## 2020-12-05 MED ORDER — GABAPENTIN 300 MG PO CAPS
ORAL_CAPSULE | ORAL | Status: AC
  Administered 2020-12-05: 300 mg via ORAL
  Filled 2020-12-05: qty 1

## 2020-12-05 MED ORDER — HYDROMORPHONE HCL 1 MG/ML IJ SOLN: 0.5000 mg | INTRAMUSCULAR | Status: DC | PRN

## 2020-12-05 MED ORDER — CELECOXIB 200 MG PO CAPS: 200.0000 mg | ORAL_CAPSULE | Freq: Two times a day (BID) | ORAL | Status: DC

## 2020-12-05 MED ORDER — ENOXAPARIN SODIUM 40 MG/0.4ML ~~LOC~~ SOLN: 40.0000 mg | SUBCUTANEOUS | 0 refills | Status: DC

## 2020-12-05 MED ORDER — DEXAMETHASONE SODIUM PHOSPHATE 10 MG/ML IJ SOLN
INTRAMUSCULAR | Status: AC
  Filled 2020-12-05: qty 1

## 2020-12-05 MED ORDER — ONDANSETRON HCL 4 MG PO TABS: 4.0000 mg | ORAL_TABLET | Freq: Four times a day (QID) | ORAL | Status: DC | PRN

## 2020-12-05 MED ORDER — DEXAMETHASONE SODIUM PHOSPHATE 10 MG/ML IJ SOLN
INTRAMUSCULAR | Status: AC
  Administered 2020-12-05: 8 mg via INTRAVENOUS
  Filled 2020-12-05: qty 1

## 2020-12-05 MED ORDER — ACETAMINOPHEN 325 MG PO TABS: 325.0000 mg | ORAL_TABLET | Freq: Four times a day (QID) | ORAL | Status: DC | PRN

## 2020-12-05 MED ORDER — MENTHOL 3 MG MT LOZG
1.0000 | LOZENGE | OROMUCOSAL | Status: DC | PRN
  Filled 2020-12-05: qty 9

## 2020-12-05 MED ORDER — LACTATED RINGERS IV BOLUS
500.0000 mL | Freq: Once | INTRAVENOUS | Status: AC
  Administered 2020-12-05: 500 mL via INTRAVENOUS

## 2020-12-05 MED ORDER — ALUM & MAG HYDROXIDE-SIMETH 200-200-20 MG/5ML PO SUSP: 30.0000 mL | ORAL | Status: DC | PRN

## 2020-12-05 MED ORDER — OXYCODONE HCL 5 MG PO TABS: 5.0000 mg | ORAL_TABLET | ORAL | Status: DC | PRN

## 2020-12-05 MED ORDER — PHENYLEPHRINE HCL (PRESSORS) 10 MG/ML IV SOLN
INTRAVENOUS | Status: AC
  Filled 2020-12-05: qty 1

## 2020-12-05 MED ORDER — SENNOSIDES-DOCUSATE SODIUM 8.6-50 MG PO TABS
1.0000 | ORAL_TABLET | Freq: Two times a day (BID) | ORAL | Status: DC
  Filled 2020-12-05: qty 1

## 2020-12-05 MED ORDER — TRANEXAMIC ACID-NACL 1000-0.7 MG/100ML-% IV SOLN: 1000.0000 mg | Freq: Once | INTRAVENOUS | Status: AC

## 2020-12-05 MED ORDER — ENSURE PRE-SURGERY PO LIQD
296.0000 mL | Freq: Once | ORAL | Status: DC
  Filled 2020-12-05: qty 296

## 2020-12-05 MED ORDER — PROPOFOL 500 MG/50ML IV EMUL
INTRAVENOUS | Status: AC
  Filled 2020-12-05: qty 50

## 2020-12-05 MED ORDER — LIDOCAINE HCL (PF) 2 % IJ SOLN
INTRAMUSCULAR | Status: AC
  Filled 2020-12-05: qty 5

## 2020-12-05 MED ORDER — OXYCODONE HCL 5 MG PO TABS: 5.0000 mg | ORAL_TABLET | ORAL | 0 refills | Status: DC | PRN

## 2020-12-05 MED ORDER — MIDAZOLAM HCL 5 MG/5ML IJ SOLN
INTRAMUSCULAR | Status: DC | PRN
  Administered 2020-12-05: 2 mg via INTRAVENOUS

## 2020-12-05 MED ORDER — BUPIVACAINE HCL (PF) 0.25 % IJ SOLN
INTRAMUSCULAR | Status: DC | PRN
  Administered 2020-12-05: 60 mL

## 2020-12-05 MED ORDER — CEFAZOLIN SODIUM-DEXTROSE 2-4 GM/100ML-% IV SOLN
2.0000 g | Freq: Four times a day (QID) | INTRAVENOUS | Status: DC
  Administered 2020-12-05: 2 g via INTRAVENOUS

## 2020-12-05 MED ORDER — SODIUM CHLORIDE 0.9 % IV SOLN: INTRAVENOUS | Status: DC

## 2020-12-05 MED ORDER — METOCLOPRAMIDE HCL 5 MG/ML IJ SOLN
INTRAMUSCULAR | Status: AC
  Administered 2020-12-05: 10 mg via INTRAVENOUS
  Filled 2020-12-05: qty 2

## 2020-12-05 MED ORDER — FLEET ENEMA 7-19 GM/118ML RE ENEM: 1.0000 | ENEMA | Freq: Once | RECTAL | Status: DC | PRN

## 2020-12-05 MED ORDER — METOCLOPRAMIDE HCL 10 MG PO TABS: 10.0000 mg | ORAL_TABLET | Freq: Three times a day (TID) | ORAL | Status: DC

## 2020-12-05 MED ORDER — ACETAMINOPHEN 10 MG/ML IV SOLN: 1000.0000 mg | Freq: Four times a day (QID) | INTRAVENOUS | Status: DC

## 2020-12-05 MED ORDER — FENTANYL CITRATE (PF) 100 MCG/2ML IJ SOLN
INTRAMUSCULAR | Status: DC | PRN
  Administered 2020-12-05 (×5): 50 ug via INTRAVENOUS

## 2020-12-05 MED ORDER — ENOXAPARIN SODIUM 40 MG/0.4ML ~~LOC~~ SOLN: 40.0000 mg | SUBCUTANEOUS | Status: DC

## 2020-12-05 MED ORDER — TRAMADOL HCL 50 MG PO TABS
50.0000 mg | ORAL_TABLET | ORAL | Status: DC | PRN
Start: 2020-12-05 — End: 2020-12-05

## 2020-12-05 MED ORDER — CELECOXIB 200 MG PO CAPS
ORAL_CAPSULE | ORAL | Status: AC
  Administered 2020-12-05: 400 mg via ORAL
  Filled 2020-12-05: qty 2

## 2020-12-05 MED ORDER — PROPOFOL 500 MG/50ML IV EMUL
INTRAVENOUS | Status: DC | PRN
  Administered 2020-12-05: 99 ug/kg/min via INTRAVENOUS

## 2020-12-05 MED ORDER — TRANEXAMIC ACID-NACL 1000-0.7 MG/100ML-% IV SOLN
INTRAVENOUS | Status: AC
  Filled 2020-12-05: qty 100

## 2020-12-05 MED ORDER — CHLORHEXIDINE GLUCONATE 0.12 % MT SOLN
OROMUCOSAL | Status: AC
  Filled 2020-12-05: qty 15

## 2020-12-05 MED ORDER — FENTANYL CITRATE (PF) 250 MCG/5ML IJ SOLN
INTRAMUSCULAR | Status: AC
  Filled 2020-12-05: qty 5

## 2020-12-05 MED ORDER — METOCLOPRAMIDE HCL 5 MG/ML IJ SOLN: 10.0000 mg | Freq: Once | INTRAMUSCULAR | Status: AC

## 2020-12-05 MED ORDER — FENTANYL CITRATE (PF) 100 MCG/2ML IJ SOLN: 25.0000 ug | INTRAMUSCULAR | Status: DC | PRN

## 2020-12-05 SURGICAL SUPPLY — 77 items
ATTUNE MED DOME PAT 32 KNEE (Knees) ×1 IMPLANT
ATTUNE PS FEM RT SZ 4 CEM KNEE (Femur) ×1 IMPLANT
ATTUNE PSRP INSR SZ4 5 KNEE (Insert) ×1 IMPLANT
BASE TIBIAL ROT PLAT SZ 3 KNEE (Knees) IMPLANT
BATTERY INSTRU NAVIGATION (MISCELLANEOUS) ×8 IMPLANT
BLADE SAW 70X12.5 (BLADE) ×2 IMPLANT
BLADE SAW 90X13X1.19 OSCILLAT (BLADE) ×2 IMPLANT
BLADE SAW 90X25X1.19 OSCILLAT (BLADE) ×2 IMPLANT
BONE CEMENT GENTAMICIN (Cement) ×4 IMPLANT
CANISTER PREVENA PLUS 150 (CANNISTER) ×2 IMPLANT
CANISTER SUCT 3000ML PPV (MISCELLANEOUS) ×2 IMPLANT
CEMENT BONE GENTAMICIN 40 (Cement) IMPLANT
COOLER POLAR GLACIER W/PUMP (MISCELLANEOUS) ×2 IMPLANT
COVER WAND RF STERILE (DRAPES) ×2 IMPLANT
CUFF TOURN SGL QUICK 24 (TOURNIQUET CUFF)
CUFF TOURN SGL QUICK 30 (TOURNIQUET CUFF)
CUFF TRNQT CYL 24X4X16.5-23 (TOURNIQUET CUFF) IMPLANT
CUFF TRNQT CYL 30X4X21-28X (TOURNIQUET CUFF) IMPLANT
DRAPE 3/4 80X56 (DRAPES) ×2 IMPLANT
DRSG DERMACEA 8X12 NADH (GAUZE/BANDAGES/DRESSINGS) ×2 IMPLANT
DRSG MEPILEX SACRM 8.7X9.8 (GAUZE/BANDAGES/DRESSINGS) ×2 IMPLANT
DRSG OPSITE POSTOP 4X14 (GAUZE/BANDAGES/DRESSINGS) ×2 IMPLANT
DRSG TEGADERM 4X4.75 (GAUZE/BANDAGES/DRESSINGS) ×2 IMPLANT
DURAPREP 26ML APPLICATOR (WOUND CARE) ×4 IMPLANT
ELECT REM PT RETURN 9FT ADLT (ELECTROSURGICAL) ×2
ELECTRODE REM PT RTRN 9FT ADLT (ELECTROSURGICAL) ×1 IMPLANT
EX-PIN ORTHOLOCK NAV 4X150 (PIN) ×4 IMPLANT
GLOVE BIO SURGEON STRL SZ7.5 (GLOVE) ×4 IMPLANT
GLOVE BIOGEL PI IND STRL 7.5 (GLOVE) ×1 IMPLANT
GLOVE BIOGEL PI INDICATOR 7.5 (GLOVE) ×1
GLOVE INDICATOR 8.0 STRL GRN (GLOVE) ×2 IMPLANT
GLOVE SURG ENC TEXT LTX SZ7.5 (GLOVE) ×4 IMPLANT
GOWN STRL REUS W/ TWL LRG LVL3 (GOWN DISPOSABLE) ×2 IMPLANT
GOWN STRL REUS W/ TWL XL LVL3 (GOWN DISPOSABLE) ×1 IMPLANT
GOWN STRL REUS W/TWL LRG LVL3 (GOWN DISPOSABLE) ×2
GOWN STRL REUS W/TWL XL LVL3 (GOWN DISPOSABLE) ×1
HEMOVAC 400CC 10FR (MISCELLANEOUS) ×2 IMPLANT
HOLDER FOLEY CATH W/STRAP (MISCELLANEOUS) ×2 IMPLANT
HOOD PEEL AWAY FLYTE STAYCOOL (MISCELLANEOUS) ×4 IMPLANT
IRRIGATION SURGIPHOR STRL (IV SOLUTION) ×2 IMPLANT
KIT PREVENA INCISION MGT20CM45 (CANNISTER) ×2 IMPLANT
KIT PUMP PREVENA PLUS 14DAY (MISCELLANEOUS) ×2 IMPLANT
KIT TURNOVER KIT A (KITS) ×2 IMPLANT
KNIFE SCULPS 14X20 (INSTRUMENTS) ×2 IMPLANT
LABEL OR SOLS (LABEL) ×2 IMPLANT
MANIFOLD NEPTUNE II (INSTRUMENTS) ×4 IMPLANT
NDL SAFETY ECLIPSE 18X1.5 (NEEDLE) ×1 IMPLANT
NDL SPNL 20GX3.5 QUINCKE YW (NEEDLE) ×2 IMPLANT
NEEDLE HYPO 18GX1.5 SHARP (NEEDLE) ×1
NEEDLE SPNL 20GX3.5 QUINCKE YW (NEEDLE) ×4 IMPLANT
NS IRRIG 500ML POUR BTL (IV SOLUTION) ×2 IMPLANT
PACK TOTAL KNEE (MISCELLANEOUS) ×2 IMPLANT
PAD WRAPON POLAR KNEE (MISCELLANEOUS) ×1 IMPLANT
PENCIL SMOKE ULTRAEVAC 22 CON (MISCELLANEOUS) ×2 IMPLANT
PIN FIXATION 1/8DIA X 3INL (PIN) ×6 IMPLANT
PULSAVAC PLUS IRRIG FAN TIP (DISPOSABLE) ×2
SOL .9 NS 3000ML IRR  AL (IV SOLUTION) ×1
SOL .9 NS 3000ML IRR UROMATIC (IV SOLUTION) ×1 IMPLANT
SOL PREP PVP 2OZ (MISCELLANEOUS) ×2
SOLUTION PREP PVP 2OZ (MISCELLANEOUS) ×1 IMPLANT
SPONGE DRAIN TRACH 4X4 STRL 2S (GAUZE/BANDAGES/DRESSINGS) ×2 IMPLANT
STAPLER SKIN PROX 35W (STAPLE) ×2 IMPLANT
STOCKINETTE IMPERV 14X48 (MISCELLANEOUS) IMPLANT
STRAP TIBIA SHORT (MISCELLANEOUS) ×2 IMPLANT
SUCTION FRAZIER HANDLE 10FR (MISCELLANEOUS) ×1
SUCTION TUBE FRAZIER 10FR DISP (MISCELLANEOUS) ×1 IMPLANT
SUT VIC AB 0 CT1 36 (SUTURE) ×4 IMPLANT
SUT VIC AB 1 CT1 36 (SUTURE) ×4 IMPLANT
SUT VIC AB 2-0 CT2 27 (SUTURE) ×2 IMPLANT
SYR 20ML LL LF (SYRINGE) ×2 IMPLANT
SYR 30ML LL (SYRINGE) ×4 IMPLANT
TIBIAL BASE ROT PLAT SZ 3 KNEE (Knees) ×2 IMPLANT
TIP FAN IRRIG PULSAVAC PLUS (DISPOSABLE) ×1 IMPLANT
TOWEL OR 17X26 4PK STRL BLUE (TOWEL DISPOSABLE) ×2 IMPLANT
TOWER CARTRIDGE SMART MIX (DISPOSABLE) ×2 IMPLANT
TRAY FOLEY MTR SLVR 16FR STAT (SET/KITS/TRAYS/PACK) ×2 IMPLANT
WRAPON POLAR PAD KNEE (MISCELLANEOUS) ×2

## 2020-12-05 NOTE — Anesthesia Procedure Notes (Signed)
Spinal  Patient location during procedure: OR Staffing Performed: resident/CRNA  Resident/CRNA: Gaynelle Cage, CRNA Preanesthetic Checklist Completed: patient identified, IV checked, site marked, risks and benefits discussed, surgical consent, monitors and equipment checked, pre-op evaluation and timeout performed Spinal Block Patient position: sitting Prep: ChloraPrep and site prepped and draped Patient monitoring: heart rate, cardiac monitor, continuous pulse ox and blood pressure Approach: midline Location: L4-5 Injection technique: single-shot Needle Needle type: Quincke  Needle gauge: 22 G Needle length: 15 cm Needle insertion depth: 11 cm Assessment Sensory level: T4

## 2020-12-05 NOTE — Discharge Instructions (Signed)
Instructions after Total Knee Replacement   Sara P. Holley Bouche., M.D.     Dept. of [facility]  [place], [place]  [address]  Phone: [phone]   Fax: [phone]    DIET: . Drink plenty of non-alcoholic fluids. . Resume your normal diet. Include foods high in fiber.  ACTIVITY:  . You may use crutches or a walker with weight-bearing as tolerated, unless instructed otherwise. . You may be weaned off of the walker or crutches by your Physical Therapist.  . Do NOT place pillows under the knee. Anything placed under the knee could limit your ability to straighten the knee.   . Continue doing gentle exercises. Exercising will reduce the pain and swelling, increase motion, and prevent muscle weakness.   . Please continue to use the TED compression stockings for 6 weeks. You may remove the stockings at night, but should reapply them in the morning. . Do not drive or operate any equipment until instructed.  WOUND CARE:  . Continue to use the PolarCare or ice packs periodically to reduce pain and swelling. . You may bathe or shower after the staples are removed at the first office visit following surgery.  MEDICATIONS: . Sara Rhodes may resume your regular medications. . Please take the pain medication as prescribed on the medication. . Do not take pain medication on an empty stomach. . You have been given a prescription for a blood thinner (Lovenox or Coumadin). Please take the medication as instructed. (NOTE: After completing a 2 week course of Lovenox, take one Enteric-coated aspirin once a day. This along with elevation will help reduce the possibility of phlebitis in your operated leg.) . Do not drive or drink alcoholic beverages when taking pain medications.  CALL THE OFFICE FOR: . Temperature above 101 degrees . Excessive bleeding or drainage on the dressing. . Excessive swelling, coldness, or paleness of the toes. . Persistent  nausea and vomiting.  FOLLOW-UP:  . You should have an appointment to return to the office in 10-14 days after surgery. . Arrangements have been made for continuation of Physical Therapy (either home therapy or outpatient therapy).     [facility] Department Directory         [email or web address]       [email or web address]          Cardiology  Appointments: [place] [phone]  Endocrinology  Appointments: [place] [phone] Sara Rhodes [phone]  Gastroenterology  Appointments: [place] [phone] [place] [phone]        General Surgery   Appointments: [facility]  Internal Medicine/Family Medicine  Appointments: [facility] [place] - [phone] [place] [phone]  Metabolic and [place] Loss Surgery  Appointments: [facility]        Neurology  Appointments: [place] [phone] [place] - [phone]  Neurosurgery  Appointments: [place]  Obstetrics & Gynecology  Appointments: [place] [phone] [place] - [phone]        Pediatrics  Appointments: [place] [phone] [place] - [phone]  Physiatry  Appointments: [place] [phone]  Physical Therapy  Appointments: [place] [phone]        Podiatry  Appointments: [place] [phone] [place] - [phone]  Pulmonology  Appointments: [place]  Rheumatology  Appointments: [place] [phone]        [place] Location: [facility]  [place],   [address]  [place] Location: [facility]  [address] [place], [place]  [address]  [place] Location: [facility] [address] [place], [place]  [address]      AMBULATORY SURGERY  DISCHARGE INSTRUCTIONS   1) The drugs that you were given will stay in your system until tomorrow so for  the next 24 hours you should not:  A) Drive an automobile B) Make any legal decisions C) Drink any alcoholic beverage   2) You may resume regular meals tomorrow.  Today it is better to start with liquids and gradually work up to solid foods.  You may eat anything you prefer, but it is better to start with liquids, then soup and crackers, and gradually work up to solid foods.   3) Please notify your doctor immediately if you have any unusual bleeding, trouble breathing, redness and pain at the surgery site, drainage, fever, or pain not relieved by medication.    4) Additional Instructions:        Please contact your physician with any problems or Same Day Surgery at [phone], Monday through Friday 6 am to 4 pm, or [place] at [facility] number at [phone].

## 2020-12-05 NOTE — Evaluation (Signed)
Physical Therapy Evaluation Patient Details Name: Sara Rhodes MRN: [id number] DOB: [birth year] Today's Date: 12/05/2020   History of Present Illness  65 y.o. female s/p right total knee arthroplasty on 12/05/20  Clinical Impression  Pt did well with PT exam POD0, showed ability to negotiate steps and ambulate ~100 ft with consistent speed, performed exercises well including SLR, she had >80 degrees of flexion and generally showed ability to safely return home with assist from family and HHPT.  Educated on HEP and course of recovery, cleared for d/c from PT perspective.    Follow Up Recommendations Home health PT;Follow surgeon's recommendation for DC plan and follow-up therapies    Equipment Recommendations  3in1 (PT)    Recommendations for Other Services       Precautions / Restrictions Precautions Precautions: Knee Precaution Booklet Issued: Yes (comment) (HEP) Restrictions Weight Bearing Restrictions: Yes RLE Weight Bearing: Weight bearing as tolerated      Mobility  Bed Mobility Overal bed mobility: Modified Independent             General bed mobility comments: able to get to EOB w/o assist, good confidence    Transfers Overall transfer level: Modified independent Equipment used: Rolling walker (2 wheeled) Transfers: Sit to/from Stand Sit to Stand: Supervision            Ambulation/Gait Ambulation/Gait assistance: Supervision Gait Distance (Feet): 120 Feet Assistive device: Rolling walker (2 wheeled)       General Gait Details: Typical hesitancy on the first few steps but was able to transition to consistent walker momentum and cadence quickly and safely  Stairs Stairs: Yes Stairs assistance: Supervision Stair Management: One rail Right;Sideways Number of Stairs: 4 General stair comments: Pt able to negotiate up/down steps w/o issue, minimal cuing and no phyiscal assist  Wheelchair Mobility    Modified Rankin (Stroke Patients Only)        Balance Overall balance assessment: Needs assistance Sitting-balance support: No upper extremity supported;Feet supported Sitting balance-Leahy Scale: Good Sitting balance - Comments: Pt able to bend over, reach beyond BOS to thread underwear through LE   Standing balance support: Bilateral upper extremity supported Standing balance-Leahy Scale: Good Standing balance comment: Pt able to maintain standing balance during sit>stand LB dressing, using RW for unilateral UE support                             Pertinent Vitals/Pain Pain Assessment: 0-10 Pain Score: 4  Pain Location: R knee Pain Descriptors / Indicators: Discomfort Pain Intervention(s): Limited activity within patient's tolerance;Monitored during session    Home Living Family/patient expects to be discharged to:: Private residence Living Arrangements: Spouse/significant other Available Help at Discharge: Family;Available 24 hours/day;Available PRN/intermittently Type of Home: House Home Access: Stairs to enter Entrance Stairs-Rails: Chemical engineer of Steps: 5 Home Layout: One level Home Equipment: Shower seat - built in;Hand held shower head;Adaptive equipment      Prior Function Level of Independence: Independent with assistive device(s)               Hand Dominance        Extremity/Trunk Assessment   Upper Extremity Assessment Upper Extremity Assessment: Overall WFL for tasks assessed    Lower Extremity Assessment Lower Extremity Assessment: Overall WFL for tasks assessed (expected post-op weakness)    Cervical / Trunk Assessment Cervical / Trunk Assessment: Normal  Communication   Communication: No difficulties  Cognition Arousal/Alertness: Awake/alert Behavior During Therapy: [facility]  for tasks assessed/performed Overall Cognitive Status: Within Functional Limits for tasks assessed                                 General Comments: Pleasant and  agreeable throughout. Appearing somewhat anxious following education provided by OT/PT; handout provided and daughter present for education of post-op precautions/activity modifications      General Comments      Exercises Total Joint Exercises Quad Sets: Strengthening;10 reps Short Arc Quad: Strengthening;AROM Heel Slides: Strengthening;10 reps Hip ABduction/ADduction: Strengthening;10 reps Straight Leg Raises: AROM;10 reps Knee Flexion: PROM;5 reps Goniometric ROM: 0-82 Other Exercises Other Exercises: educated on HEP   Assessment/Plan    PT Assessment Patient needs continued PT services  PT Problem List Decreased strength;Decreased range of motion;Decreased activity tolerance;Decreased balance;Decreased mobility;Decreased coordination;Decreased knowledge of use of DME;Decreased safety awareness;Pain       PT Treatment Interventions DME instruction;Gait training;Stair training;Therapeutic exercise;Functional mobility training;Therapeutic activities;Balance training;Neuromuscular re-education;Patient/family education    PT Goals (Current goals can be found in the Care Plan section)  Acute Rehab PT Goals Patient Stated Goal: to go home PT Goal Formulation: With patient Time For Goal Achievement: 12/19/20 Potential to Achieve Goals: Good    Frequency BID   Barriers to discharge        Co-evaluation               AM-PAC PT "6 Clicks" Mobility  Outcome Measure Help needed turning from your back to your side while in a flat bed without using bedrails?: None Help needed moving from lying on your back to sitting on the side of a flat bed without using bedrails?: None Help needed moving to and from a bed to a chair (including a wheelchair)?: None Help needed standing up from a chair using your arms (e.g., wheelchair or bedside chair)?: None Help needed to walk in hospital room?: A Little Help needed climbing 3-5 steps with a railing? : A Little 6 Click Score: 22     End of Session Equipment Utilized During Treatment: Gait belt;Oxygen Activity Tolerance: Patient tolerated treatment well Patient left: in chair;with call bell/phone within reach;with nursing/sitter in room Nurse Communication: Mobility status PT Visit Diagnosis: Muscle weakness (generalized) (M62.81);Difficulty in walking, not elsewhere classified (R26.2);Pain Pain - Right/Left: Right Pain - part of body: Knee    Time: 3567-0141 PT Time Calculation (min) (ACUTE ONLY): 50 min   Charges:   PT Evaluation $PT Eval Low Complexity: 1 Low PT Treatments $Gait Training: 8-22 mins $Therapeutic Exercise: 8-22 mins        Kreg Shropshire, DPT 12/05/2020, 6:16 PM

## 2020-12-05 NOTE — Transfer of Care (Signed)
Immediate Anesthesia Transfer of Care Note  Patient: Sara Rhodes  Procedure(s) Performed: COMPUTER ASSISTED TOTAL KNEE ARTHROPLASTY (Right Knee)  Patient Location: PACU  Anesthesia Type:Spinal  Level of Consciousness: awake  Airway & Oxygen Therapy: Patient Spontanous Breathing and Patient connected to face mask oxygen  Post-op Assessment: Report given to RN and Post -op Vital signs reviewed and stable  Post vital signs: Reviewed and stable  Last Vitals:  Vitals Value Taken Time  BP 120/64 12/05/20 1209  Temp    Pulse 76 12/05/20 1212  Resp 15 12/05/20 1212  SpO2 100 % 12/05/20 1212  Vitals shown include unvalidated device data.  Last Pain:  Vitals:   12/05/20 0702  TempSrc: Temporal  PainSc: 5       Patients Stated Pain Goal: 3 (97/58/83 2549)  Complications: No complications documented.

## 2020-12-05 NOTE — Anesthesia Preprocedure Evaluation (Signed)
Anesthesia Evaluation  Patient identified by MRN, date of birth, ID band Patient awake    Reviewed: Allergy & Precautions, NPO status , Patient's Chart, lab work & pertinent test results  History of Anesthesia Complications Negative for: history of anesthetic complications  Airway Mallampati: III       Dental   Pulmonary neg sleep apnea, neg COPD, Not current smoker, former smoker,           Cardiovascular hypertension, Pt. on medications (-) Past MI and (-) CHF (-) dysrhythmias (-) Valvular Problems/Murmurs     Neuro/Psych neg Seizures    GI/Hepatic Neg liver ROS, GERD  Medicated and Controlled,  Endo/Other  neg diabetes  Renal/GU negative Renal ROS     Musculoskeletal   Abdominal   Peds  Hematology   Anesthesia Other Findings   Reproductive/Obstetrics                             Anesthesia Physical Anesthesia Plan  ASA: II  Anesthesia Plan: Spinal   Post-op Pain Management:    Induction:   PONV Risk Score and Plan:   Airway Management Planned:   Additional Equipment:   Intra-op Plan:   Post-operative Plan:   Informed Consent: I have reviewed the patients History and Physical, chart, labs and discussed the procedure including the risks, benefits and alternatives for the proposed anesthesia with the patient or authorized representative who has indicated his/her understanding and acceptance.       Plan Discussed with:   Anesthesia Plan Comments:         Anesthesia Quick Evaluation

## 2020-12-05 NOTE — H&P (Signed)
The patient has been re-examined, and the chart reviewed, and there have been no interval changes to the documented history and physical.    The risks, benefits, and alternatives have been discussed at length. The patient expressed understanding of the risks benefits and agreed with plans for surgical intervention.  Leilani Cespedes P. Tallis Soledad, Jr. M.D.    

## 2020-12-05 NOTE — Progress Notes (Signed)
[facility] already arranged by surgeon's office, confirmed with [employer].  3 in  1 ordered through [employer] and will be delivered to bedside.  Patient has a RW already.  TOC signing off.  Oleh Genin, [place]

## 2020-12-05 NOTE — Evaluation (Signed)
Occupational Therapy Evaluation Patient Details Name: Sara Rhodes MRN: [id number] DOB: [birth year] Today's Date: 12/05/2020    History of Present Illness 65 y.o. female s/p right total knee arthroplasty on 12/05/20   Clinical Impression   Pt seen for OT evaluation this date, POD#0 from right total knee arthroplasty. Pt was independent in all ADLs prior to surgery, however occasionally using RW for functional mobility. Pt is eager to return to PLOF with less pain and improved safety and independence. Pt currently requires MOD A for LB dressing due to pain and limited AROM of R knee. Pt able to complete UB dressing with supervision/safety for line management. Anticipate MOD-I/SUPERVISION for all seated ADLs. Pt instructed in polar care mgt, falls prevention strategies, home/routines modifications, DME/AE for LB bathing and dressing tasks, and compression stocking mgt; handout provided. Pt would benefit from skilled OT services including additional instruction in dressing techniques with or without assistive devices for dressing and bathing skills to support recall and carryover prior to discharge and ultimately to maximize safety, independence, and minimize falls risk and caregiver burden. Do not currently anticipate any OT needs following this hospitalization.      Follow Up Recommendations  No OT follow up    Equipment Recommendations  3 in 1 bedside commode       Precautions / Restrictions Precautions Precautions: Knee Restrictions Weight Bearing Restrictions: Yes RLE Weight Bearing: Weight bearing as tolerated      Mobility                Transfers Overall transfer level: Needs assistance   Transfers: Sit to/from Stand Sit to Stand: Supervision              Balance Overall balance assessment: Needs assistance Sitting-balance support: No upper extremity supported;Feet supported Sitting balance-Leahy Scale: Good Sitting balance - Comments: Pt able to bend over,  reach beyond BOS to thread underwear through LE   Standing balance support: Single extremity supported;During functional activity Standing balance-Leahy Scale: Fair Standing balance comment: Pt able to maintain standing balance during sit>stand LB dressing, using RW for unilateral UE support                           ADL either performed or assessed with clinical judgement   ADL Overall ADL's : Needs assistance/impaired                 Upper Body Dressing : Supervision/safety;Sitting Upper Body Dressing Details (indicate cue type and reason): To don overhead shirt/gown; supervision for line management Lower Body Dressing: Moderate assistance;Sit to/from stand Lower Body Dressing Details (indicate cue type and reason): Pt able to don underwear sit>stand, with MIN GUARD and RW for unilateral UE balance. Pt required MAX A to don socks/shoes.               General ADL Comments: Anticipate MOD-I/SUPERVISION for all seated ADLs.                  Pertinent Vitals/Pain Pain Assessment: 0-10 Pain Score: 3  Pain Location: R knee Pain Descriptors / Indicators: Discomfort Pain Intervention(s): Limited activity within patient's tolerance;Monitored during session        Extremity/Trunk Assessment Upper Extremity Assessment Upper Extremity Assessment: Overall WFL for tasks assessed   Lower Extremity Assessment Lower Extremity Assessment: Defer to PT evaluation   Cervical / Trunk Assessment Cervical / Trunk Assessment: Normal   Communication Communication Communication: No difficulties   Cognition Arousal/Alertness:  Awake/alert Behavior During Therapy: WFL for tasks assessed/performed Overall Cognitive Status: Within Functional Limits for tasks assessed                                 General Comments: Pleasant and agreeable throughout. Appearing somewhat anxious following education provided by OT/PT; handout provided and daughter present for  education of post-op precautions/activity modifications      Exercises Other Exercises Other Exercises: Pt instructed in polar care mgt, falls prevention strategies, home/routines modifications, DME/AE for LB bathing and dressing tasks, and compression stocking mgt; handout provided        Home Living Family/patient expects to be discharged to:: Private residence Living Arrangements: Spouse/significant other Available Help at Discharge: Family;Available 24 hours/day;Available PRN/intermittently (Husband available 24/7, and daughter available PRN) Type of Home: House Home Access: Stairs to enter [employer] of Steps: 5 Entrance Stairs-Rails: Left;Right Home Layout: One level     Bathroom Shower/Tub: Walk-in shower;Door   [employer] Toilet: Handicapped height     Home Equipment: Shower seat - built in;Hand held shower head;Adaptive equipment Adaptive Equipment: Reacher        Prior Functioning/Environment Level of Independence: Independent with assistive device(s)                 OT Problem List: Decreased strength;Impaired balance (sitting and/or standing);Decreased knowledge of precautions;Decreased activity tolerance      OT Treatment/Interventions: Self-care/ADL training;Therapeutic exercise;Energy conservation;DME and/or AE instruction;Patient/family education;Balance training    OT Goals(Current goals can be found in the care plan section) Acute Rehab OT Goals Patient Stated Goal: to go home OT Goal Formulation: With patient Time For Goal Achievement: 12/19/20 Potential to Achieve Goals: Good  OT Frequency: Min 1X/week          AM-PAC OT "6 Clicks" Daily Activity     Outcome Measure Help from another person eating meals?: None Help from another person taking care of personal grooming?: None Help from another person toileting, which includes using toliet, bedpan, or urinal?: A Little Help from another person bathing (including washing, rinsing,  drying)?: A Little Help from another person to put on and taking off regular upper body clothing?: A Little Help from another person to put on and taking off regular lower body clothing?: A Lot 6 Click Score: 19   End of Session Equipment Utilized During Treatment: Gait belt;Rolling walker Nurse Communication: Mobility status  Activity Tolerance: Patient tolerated treatment well Patient left: in chair;with nursing/sitter in room;with family/visitor present  OT Visit Diagnosis: Unsteadiness on feet (R26.81)                Time: 1610-9604 OT Time Calculation (min): 32 min Charges:  OT General Charges $OT Visit: 1 Visit OT Evaluation $OT Eval Moderate Complexity: 1 Mod OT Treatments $Self Care/Home Management : 8-22 mins  Fredirick Maudlin, OTR/L [place]

## 2020-12-05 NOTE — Anesthesia Postprocedure Evaluation (Signed)
Anesthesia Post Note  Patient: Sara Rhodes  Procedure(s) Performed: COMPUTER ASSISTED TOTAL KNEE ARTHROPLASTY (Right Knee)  Patient location during evaluation: PACU Anesthesia Type: Spinal Level of consciousness: awake and alert Pain management: pain level controlled Vital Signs Assessment: post-procedure vital signs reviewed and stable Respiratory status: spontaneous breathing and respiratory function stable Cardiovascular status: stable Anesthetic complications: no   No complications documented.   Last Vitals:  Vitals:   12/05/20 1300 12/05/20 1332  BP: 106/64 106/86  Pulse: (!) 58 (!) 49  Resp: (!) 1 (!) 22  Temp:  36.7 C  SpO2: 94% 95%    Last Pain:  Vitals:   12/05/20 1332  TempSrc: Temporal  PainSc: 1                  Bryker Fletchall K

## 2020-12-05 NOTE — Op Note (Signed)
OPERATIVE NOTE  DATE OF SURGERY:  12/05/2020  PATIENT NAME:  Sara Rhodes   DOB: [birth year]  MRN: [id number]  PRE-OPERATIVE DIAGNOSIS: Degenerative arthrosis of the right knee, primary  POST-OPERATIVE DIAGNOSIS:  Same  PROCEDURE:  Right total knee arthroplasty using computer-assisted navigation  SURGEON:  Marciano Sequin. M.D.  ASSISTANT: Cassell Smiles, PA-C (present and scrubbed throughout the case, critical for assistance with exposure, retraction, instrumentation, and closure)  ANESTHESIA: spinal  ESTIMATED BLOOD LOSS: 50 mL  FLUIDS REPLACED: 1300 mL of crystalloid  TOURNIQUET TIME: 102 minutes  DRAINS: Wound VAC  SOFT TISSUE RELEASES: Anterior cruciate ligament, posterior cruciate ligament, deep medial collateral ligament, patellofemoral ligament  IMPLANTS UTILIZED: DePuy Attune size 4 posterior stabilized femoral component (cemented), size 3 rotating platform tibial component (cemented), 32 mm medialized dome patella (cemented), and a 5 mm stabilized rotating platform polyethylene insert.  INDICATIONS FOR SURGERY: Sara Rhodes is a 65 y.o. year old female with a long history of progressive knee pain. X-rays demonstrated severe degenerative changes in tricompartmental fashion. The patient had not seen any significant improvement despite conservative nonsurgical intervention. After discussion of the risks and benefits of surgical intervention, the patient expressed understanding of the risks benefits and agree with plans for total knee arthroplasty.   The risks, benefits, and alternatives were discussed at length including but not limited to the risks of infection, bleeding, nerve injury, stiffness, blood clots, the need for revision surgery, cardiopulmonary complications, among others, and they were willing to proceed.  PROCEDURE IN DETAIL: The patient was brought into the operating room and, after adequate spinal anesthesia was achieved, a tourniquet was placed on the  patient's upper thigh. The patient's knee and leg were cleaned and prepped with alcohol and DuraPrep and draped in the usual sterile fashion. A "timeout" was performed as per usual protocol. The lower extremity was exsanguinated using an Esmarch, and the tourniquet was inflated to 300 mmHg. An anterior longitudinal incision was made followed by a standard mid vastus approach. The deep fibers of the medial collateral ligament were elevated in a subperiosteal fashion off of the medial flare of the tibia so as to maintain a continuous soft tissue sleeve. The patella was subluxed laterally and the patellofemoral ligament was incised. Inspection of the knee demonstrated severe degenerative changes with full-thickness loss of articular cartilage. Osteophytes were debrided using a rongeur. Anterior and posterior cruciate ligaments were excised. Two 4.0 mm Schanz pins were inserted in the femur and into the tibia for attachment of the array of trackers used for computer-assisted navigation. Hip center was identified using a circumduction technique. Distal landmarks were mapped using the computer. The distal femur and proximal tibia were mapped using the computer. The distal femoral cutting guide was positioned using computer-assisted navigation so as to achieve a 5 distal valgus cut. The femur was sized and it was felt that a size 4 femoral component was appropriate. A size 4 femoral cutting guide was positioned and the anterior cut was performed and verified using the computer. This was followed by completion of the posterior and chamfer cuts. Femoral cutting guide for the central box was then positioned in the center box cut was performed.  Attention was then directed to the proximal tibia. Medial and lateral menisci were excised. The extramedullary tibial cutting guide was positioned using computer-assisted navigation so as to achieve a 0 varus-valgus alignment and 3 posterior slope. The cut was performed and  verified using the computer. The proximal tibia was sized and  it was felt that a size 3 tibial tray was appropriate. Tibial and femoral trials were inserted followed by insertion of a 5 mm polyethylene insert. This allowed for excellent mediolateral soft tissue balancing both in flexion and in full extension. Finally, the patella was cut and prepared so as to accommodate a 32 mm medialized dome patella. A patella trial was placed and the knee was placed through a range of motion with excellent patellar tracking appreciated. The femoral trial was removed after debridement of posterior osteophytes. The central post-hole for the tibial component was reamed followed by insertion of a keel punch. Tibial trials were then removed. Cut surfaces of bone were irrigated with copious amounts of normal saline using pulsatile lavage and then suctioned dry. Polymethylmethacrylate cement with gentamicin was prepared in the usual fashion using a vacuum mixer. Cement was applied to the cut surface of the proximal tibia as well as along the undersurface of a size 3 rotating platform tibial component. Tibial component was positioned and impacted into place. Excess cement was removed using [employer] fast streamer. Cement was then applied to the cut surfaces of the femur as well as along the posterior flanges of the size 4 femoral component. The femoral component was positioned and impacted into place. Excess cement was removed using [employer] fast streamer. A 5 mm polyethylene trial was inserted and the knee was brought into full extension with steady axial compression applied. Finally, cement was applied to the backside of a 32 mm medialized dome patella and the patellar component was positioned and patellar clamp applied. Excess cement was removed using [employer] fast streamer. After adequate curing of the cement, the tourniquet was deflated after a total tourniquet time of 102 minutes. Hemostasis was achieved using electrocautery. The knee was irrigated  with copious amounts of normal saline using pulsatile lavage followed by 500 ml of Surgiphor and then suctioned dry. 20 mL of 1.3% Exparel and 60 mL of 0.25% Marcaine in 40 mL of normal saline was injected along the posterior capsule, medial and lateral gutters, and along the arthrotomy site. A 5 mm stabilized rotating platform polyethylene insert was inserted and the knee was placed through a range of motion with excellent mediolateral soft tissue balancing appreciated and excellent patellar tracking noted. The medial parapatellar portion of the incision was reapproximated using interrupted sutures of #1 Vicryl. Subcutaneous tissue was approximated in layers using first #0 Vicryl followed #2-0 Vicryl. The skin was approximated with skin staples. A wound VAC and sterile dressing were applied.  The patient tolerated the procedure well and was transported to the recovery room in stable condition.    James P. Holley Bouche., M.D.

## 2020-12-06 ENCOUNTER — Encounter: Payer: Self-pay | Admitting: Orthopedic Surgery

## 2021-03-12 ENCOUNTER — Encounter: Payer: Self-pay | Admitting: Surgical

## 2021-03-12 ENCOUNTER — Other Ambulatory Visit: Payer: Self-pay

## 2021-03-12 ENCOUNTER — Encounter: Payer: Self-pay | Admitting: Obstetrics and Gynecology

## 2021-03-12 ENCOUNTER — Ambulatory Visit (INDEPENDENT_AMBULATORY_CARE_PROVIDER_SITE_OTHER): Payer: Commercial Managed Care - PPO | Admitting: Obstetrics and Gynecology

## 2021-03-12 VITALS — BP 121/65 | HR 69 | Ht 62.0 in | Wt 218.0 lb

## 2021-03-12 DIAGNOSIS — N3001 Acute cystitis with hematuria: Secondary | ICD-10-CM | POA: Diagnosis not present

## 2021-03-12 DIAGNOSIS — R102 Pelvic and perineal pain: Secondary | ICD-10-CM

## 2021-03-12 LAB — POCT URINALYSIS DIPSTICK
Bilirubin, UA: NEGATIVE
Glucose, UA: NEGATIVE
Ketones, UA: NEGATIVE
Protein, UA: POSITIVE — AB
Spec Grav, UA: 1.01 (ref 1.010–1.025)
Urobilinogen, UA: 0.2 E.U./dL
pH, UA: 5.5 (ref 5.0–8.0)

## 2021-03-12 MED ORDER — NITROFURANTOIN MONOHYD MACRO 100 MG PO CAPS: 100.0000 mg | ORAL_CAPSULE | Freq: Two times a day (BID) | ORAL | 1 refills | Status: DC

## 2021-03-12 NOTE — Progress Notes (Signed)
HPI:      [name] is a 65 y.o. [phone] who LMP was No LMP recorded. Patient has had a hysterectomy.  Subjective:   She presents today stating that for the last week she has had discomfort with urination.  She says that yesterday it got significantly worse with pain and burning with urination.  She was urinating "every 5 minutes."  Took AZO without success.    Hx: The following portions of the patient's history were reviewed and updated as appropriate:             She  has a past medical history of Acid reflux, Behcet's disease ([place]), Family history of anesthesia complication, Fatty liver, Hyperlipemia, Hypertension, Obesity, Schatzki's ring, Tobacco use, Vaginal atrophy, and Vulvar itching. She does not have any pertinent problems on file. She  has a past surgical history that includes Thoracic discectomy (Right, 03/14/2013); Cesarean section; Back surgery; Colonoscopy with propofol (N/A, 12/31/2015); Esophagogastroduodenoscopy (egd) with propofol (N/A, 12/31/2015); Abdominal hysterectomy; Knee arthroscopy (Right, 06/25/2020); and Knee Arthroplasty (Right, 12/05/2020). Her family history includes AAA (abdominal aortic aneurysm) in her father; Crohn's disease in her daughter; Hypertension in her father; Kidney failure in her mother; Leukemia in her mother. She  reports that she quit smoking about 3 years ago. Her smoking use included cigarettes. She has a 5.00 pack-year smoking history. She has never used smokeless tobacco. She reports that she does not drink alcohol and does not use drugs. She has a current medication list which includes the following prescription(s): amlodipine-benazepril, carboxymethylcellul-glycerin, celecoxib, eucrisa, enoxaparin, hydrochlorothiazide, nitrofurantoin (macrocrystal-monohydrate), nystatin-triamcinolone, omeprazole, simvastatin, turmeric, valacyclovir, and oxycodone. She is allergic to ibuprofen, tylenol [acetaminophen], latex, and penicillins.       Review  of Systems:  Review of Systems  Constitutional: Denied constitutional symptoms, night sweats, recent illness, fatigue, fever, insomnia and weight loss.  Eyes: Denied eye symptoms, eye pain, photophobia, vision change and visual disturbance.  Ears/Nose/Throat/Neck: Denied ear, nose, throat or neck symptoms, hearing loss, nasal discharge, sinus congestion and sore throat.  Cardiovascular: Denied cardiovascular symptoms, arrhythmia, chest pain/pressure, edema, exercise intolerance, orthopnea and palpitations.  Respiratory: Denied pulmonary symptoms, asthma, pleuritic pain, productive sputum, cough, dyspnea and wheezing.  Gastrointestinal: Denied, gastro-esophageal reflux, melena, nausea and vomiting.  Genitourinary: See HPI for additional information.  Musculoskeletal: Denied musculoskeletal symptoms, stiffness, swelling, muscle weakness and myalgia.  Dermatologic: Denied dermatology symptoms, rash and scar.  Neurologic: Denied neurology symptoms, dizziness, headache, neck pain and syncope.  Psychiatric: Denied psychiatric symptoms, anxiety and depression.  Endocrine: Denied endocrine symptoms including hot flashes and night sweats.   Meds:   Current Outpatient Medications on File Prior to Visit  Medication Sig Dispense Refill  . amLODipine-benazepril (LOTREL) 5-20 MG per capsule Take 1 capsule by mouth at bedtime.    . carboxymethylcellul-glycerin (REFRESH OPTIVE) 0.5-0.9 % ophthalmic solution Place 1 drop into both eyes 3 (three) times daily as needed for dry eyes.    . celecoxib (CELEBREX) 200 MG capsule Take 200 mg by mouth 2 (two) times daily.    Stasia Cavalier (EUCRISA) 2 % OINT Apply 2 % topically daily as needed (irritation).    . enoxaparin (LOVENOX) 40 MG/0.4ML injection Inject 0.4 mLs (40 mg total) into the skin daily. 5.6 mL 0  . hydrochlorothiazide (HYDRODIURIL) 25 MG tablet Take 25 mg by mouth daily.    Marland Kitchen nystatin-triamcinolone (MYCOLOG II) cream Apply 1 application topically  daily as needed. (Patient taking differently: Apply 1 application topically daily as needed (irritation).) 60 g 1  .  omeprazole (PRILOSEC) 20 MG capsule Take 20 mg by mouth daily.    . simvastatin (ZOCOR) 20 MG tablet Take 20 mg by mouth at bedtime.    . Turmeric 500 MG CAPS Take 500 mg by mouth daily.    . valACYclovir (VALTREX) 1000 MG tablet Take 1,000 mg by mouth See admin instructions. At first sign of outbreak, take 1 tablet (1,000 mg total) by mouth twice daily for 2-5 days (less if lesion resolves).    Marland Kitchen oxyCODONE (ROXICODONE) 5 MG immediate release tablet Take 1-2 tablets (5-10 mg total) by mouth every 4 (four) hours as needed for severe pain. (Patient not taking: Reported on 03/12/2021) 30 tablet 0   No current facility-administered medications on file prior to visit.          Objective:     Vitals:   03/12/21 1113  BP: 121/65  Pulse: 69   Filed Weights   03/12/21 1113  Weight: 218 lb (98.9 kg)              UA consistent with UTI with hematuria  Assessment:    [id number] Patient Active Problem List   Diagnosis Date Noted  . 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 Borderline diabetes mellitus 06/24/2018  . Status post hysterectomy with oophorectomy 01/21/2017  . Fatty liver 10/24/2015  . GERD (gastroesophageal reflux disease) 03/10/2014  . HTN (hypertension) 03/10/2014  . Obesity 03/10/2014  . Hypercholesterolemia 03/10/2014  . Varicose veins of bilateral lower extremities with other complications 63/87/5643  . HNP (herniated nucleus pulposus), thoracic 03/14/2013     1. Acute cystitis with hematuria   2. Pelvic pressure in female        Plan:            1.  Macrobid prescribed  2.  Patient encouraged to push fluids  3.  Use of bladder anesthetic for first few days of antibiotics discussed. Orders Orders Placed This Encounter  Procedures  . Urine Culture  . POCT urinalysis dipstick     Meds ordered this encounter  Medications  .  nitrofurantoin, macrocrystal-monohydrate, (MACROBID) 100 MG capsule    Sig: Take 1 capsule (100 mg total) by mouth 2 (two) times daily.    Dispense:  14 capsule    Refill:  1      F/U  No follow-ups on file. I spent 21 minutes involved in the care of this patient preparing to see the patient by obtaining and reviewing her medical history (including labs, imaging tests and prior procedures), documenting clinical information in the electronic health record (EHR), counseling and coordinating care plans, writing and sending prescriptions, ordering tests or procedures and directly communicating with the patient by discussing pertinent items from her history and physical exam as well as detailing my assessment and plan as noted above so that she has an informed understanding.  All of her questions were answered.  Finis Bud, M.D. 03/12/2021 11:43 AM

## 2021-03-18 LAB — URINE CULTURE

## 2021-05-09 ENCOUNTER — Ambulatory Visit
Admission: EM | Admit: 2021-05-09 | Discharge: 2021-05-09 | Disposition: A | Discharge status: Home / Self Care | Payer: PPO

## 2021-05-09 ENCOUNTER — Other Ambulatory Visit: Payer: Self-pay

## 2021-05-09 DIAGNOSIS — Z96651 Presence of right artificial knee joint: Secondary | ICD-10-CM | POA: Diagnosis not present

## 2021-05-09 DIAGNOSIS — J069 Acute upper respiratory infection, unspecified: Secondary | ICD-10-CM

## 2021-05-09 MED ORDER — IPRATROPIUM BROMIDE 0.06 % NA SOLN: 2.0000 | Freq: Four times a day (QID) | NASAL | 12 refills | Status: DC

## 2021-05-09 MED ORDER — BENZONATATE 100 MG PO CAPS: 200.0000 mg | ORAL_CAPSULE | Freq: Three times a day (TID) | ORAL | 0 refills | Status: DC

## 2021-05-09 MED ORDER — PROMETHAZINE-DM 6.25-15 MG/5ML PO SYRP: 5.0000 mL | ORAL_SOLUTION | Freq: Four times a day (QID) | ORAL | 0 refills | Status: DC | PRN

## 2021-05-09 NOTE — ED Triage Notes (Signed)
Pt c/o sinus pain/pressure and cough for about a week. Pt has been taking Nyquil and this has helped some. Pt has also been taking valacyclovir for a fever blister. Pt denies f/n/v/d or other symptoms.

## 2021-05-09 NOTE — Discharge Instructions (Addendum)
Use the Atrovent nasal spray, 2 squirts in each nostril every 6 hours, as needed for runny nose and postnasal drip.  Use the Tessalon Perles every 8 hours during the day.  Take them with a small sip of water.  They may give you some numbness to the base of your tongue or a metallic taste in your mouth, this is normal.  Use the Promethazine DM cough syrup at bedtime for cough and congestion.  It will make you drowsy so do not take it during the day.  Perform sinus irrigation 2-3 times a day with a Neilmed Sinus Rinse kit and distilled water. Do not use tap water.   Return for reevaluation or see your primary care provider for any new or worsening symptoms.

## 2021-05-09 NOTE — ED Provider Notes (Signed)
[facility]    CSN: [id number] Arrival date & time: 05/09/21  0801      History   Chief Complaint Chief Complaint  Patient presents with   Nasal Congestion    HPI Sara Rhodes is a 65 y.o. female.   HPI  65 year old female here for evaluation of sinus symptoms.  Patient reports that the last 3 days she has been experiencing nasal congestion with clear nasal discharge, sinus pain and pressure, ear pain, and a nonproductive cough.  She denies fever, shortness of breath or wheezing, sore throat, GI complaints, or body aches.  Past Medical History:  Diagnosis Date   Acid reflux    Behcet's disease ([place])    Family history of anesthesia complication    N/V daughter   Fatty liver    Hyperlipemia    Hypertension    Obesity    Schatzki's ring    Tobacco use    Vaginal atrophy    Vulvar itching     Patient Active Problem List   Diagnosis Date Noted   Chondromalacia of right knee 08/12/2020   Borderline diabetes mellitus 06/24/2018   Status post hysterectomy with oophorectomy 01/21/2017   Fatty liver 10/24/2015   GERD (gastroesophageal reflux disease) 03/10/2014   HTN (hypertension) 03/10/2014   Obesity 03/10/2014   Hypercholesterolemia 03/10/2014   Varicose veins of bilateral lower extremities with other complications 43/32/9518   HNP (herniated nucleus pulposus), thoracic 03/14/2013    Past Surgical History:  Procedure Laterality Date   ABDOMINAL HYSTERECTOMY     total- lsh bso- fibroids   BACK SURGERY     CESAREAN SECTION     COLONOSCOPY WITH PROPOFOL N/A 12/31/2015   Procedure: COLONOSCOPY WITH PROPOFOL;  Surgeon: Manya Silvas, MD;  Location: [place];  Service: Endoscopy;  Laterality: N/A;   ESOPHAGOGASTRODUODENOSCOPY (EGD) WITH PROPOFOL N/A 12/31/2015   Procedure: ESOPHAGOGASTRODUODENOSCOPY (EGD) WITH PROPOFOL;  Surgeon: Manya Silvas, MD;  Location: [facility];  Service: Endoscopy;  Laterality: N/A;   KNEE ARTHROPLASTY Right  12/05/2020   Procedure: COMPUTER ASSISTED TOTAL KNEE ARTHROPLASTY;  Surgeon: Dereck Leep, MD;  Location: [facility];  Service: Orthopedics;  Laterality: Right;   KNEE ARTHROSCOPY Right 06/25/2020   Procedure: ARTHROSCOPY KNEE;  Surgeon: Dereck Leep, MD;  Location: [facility];  Service: Orthopedics;  Laterality: Right;   THORACIC DISCECTOMY Right 03/14/2013   Procedure: THORACIC DISCECTOMY;  Surgeon: Charlie Pitter, MD;  Location: [place] [facility];  Service: Neurosurgery;  Laterality: Right;  Thoracic four-five and thoracic nine-ten laminotomy with transpedicular microdiskectomy    OB History     Gravida  3   Para  3   Term  3   Preterm  0   AB  0   Living  3      SAB  0   IAB  0   Ectopic  0   Multiple  0   Live Births  3        Obstetric Comments  1st Menstrual Cycle: 12 1st Pregnancy: 25          Home Medications    Prior to Admission medications   Medication Sig Start Date End Date Taking? Authorizing Provider  amLODipine-benazepril (LOTREL) 5-20 MG per capsule Take 1 capsule by mouth at bedtime.   Yes [provider]  benzonatate (TESSALON) 100 MG capsule Take 2 capsules (200 mg total) by mouth every 8 (eight) hours. 05/09/21  Yes Margarette Canada, NP  Black Pepper-Turmeric 3-500 MG CAPS  Take by mouth.   Yes [provider]  carboxymethylcellul-glycerin (REFRESH OPTIVE) 0.5-0.9 % ophthalmic solution Place 1 drop into both eyes 3 (three) times daily as needed for dry eyes.   Yes [provider]  celecoxib (CELEBREX) 200 MG capsule Take 200 mg by mouth 2 (two) times daily.   Yes [provider]  Crisaborole (EUCRISA) 2 % OINT Apply 2 % topically daily as needed (irritation).   Yes [provider]  hydrochlorothiazide (HYDRODIURIL) 25 MG tablet Take 25 mg by mouth daily.   Yes [provider]  ipratropium (ATROVENT) 0.06 % nasal spray Place 2 sprays into both nostrils 4 (four) times daily. 05/09/21  Yes Margarette Canada, NP   nystatin-triamcinolone (MYCOLOG II) cream Apply 1 application topically daily as needed. Patient taking differently: Apply 1 application topically daily as needed (irritation). 08/28/20  Yes Harlin Heys, MD  omeprazole (PRILOSEC) 20 MG capsule Take 20 mg by mouth daily.   Yes [provider]  promethazine-dextromethorphan (PROMETHAZINE-DM) 6.25-15 MG/5ML syrup Take 5 mLs by mouth 4 (four) times daily as needed. 05/09/21  Yes Margarette Canada, NP  simvastatin (ZOCOR) 20 MG tablet Take 20 mg by mouth at bedtime.   Yes [provider]  Turmeric 500 MG CAPS Take 500 mg by mouth daily.   Yes [provider]  valACYclovir (VALTREX) 1000 MG tablet Take 1,000 mg by mouth See admin instructions. At first sign of outbreak, take 1 tablet (1,000 mg total) by mouth twice daily for 2-5 days (less if lesion resolves). 08/26/19  Yes [provider]    Family History Family History  Problem Relation Age of Onset   Crohn's disease Daughter    Leukemia Mother    Kidney failure Mother    AAA (abdominal aortic aneurysm) Father    Hypertension Father    Cancer Neg Hx    Diabetes Neg Hx    Heart disease Neg Hx     Social History Social History   Tobacco Use   Smoking status: Former    Packs/day: 0.50    Years: 10.00    Pack years: 5.00    Types: Cigarettes    Quit date: 06/05/2017    Years since quitting: 3.9   Smokeless tobacco: Never  Vaping Use   Vaping Use: Never used  Substance Use Topics   Alcohol use: No   Drug use: No     Allergies   Ibuprofen, Tylenol [acetaminophen], Latex, Penicillin g, and Penicillins   Review of Systems Review of Systems  Constitutional:  Negative for activity change, appetite change and fever.  HENT:  Positive for congestion, ear pain, rhinorrhea, sinus pressure and sinus pain. Negative for sore throat.   Respiratory:  Positive for cough. Negative for shortness of breath and wheezing.   Gastrointestinal:  Negative for  diarrhea, nausea and vomiting.  Musculoskeletal:  Negative for arthralgias and myalgias.  Hematological: Negative.   Psychiatric/Behavioral: Negative.      Physical Exam Triage Vital Signs ED Triage Vitals  Enc Vitals Group     BP --      Pulse --      Resp --      Temp --      Temp src --      SpO2 --      Weight 05/09/21 0818 210 lb (95.3 kg)     Height 05/09/21 0818 _0  (1.575 m)     Head Circumference --      Peak Flow --  Pain Score 05/09/21 0817 7     Pain Loc --      Pain Edu? --      Excl. in [place]? --    No data found.  Updated Vital Signs BP 136/66 (BP Location: Left Arm)   Pulse 64   Temp 98.3 F (36.8 C) (Oral)   Resp 18   Ht _0  (1.575 m)   Wt 210 lb (95.3 kg)   SpO2 99%   BMI 38.41 kg/m   Visual Acuity Right Eye Distance:   Left Eye Distance:   Bilateral Distance:    Right Eye Near:   Left Eye Near:    Bilateral Near:     Physical Exam Vitals and nursing note reviewed.  Constitutional:      Appearance: Normal appearance. She is obese. She is not ill-appearing.  HENT:     Head: Normocephalic and atraumatic.     Right Ear: Tympanic membrane, ear canal and external ear normal. There is no impacted cerumen.     Left Ear: Tympanic membrane, ear canal and external ear normal. There is no impacted cerumen.     Nose: Congestion and rhinorrhea present.     Mouth/Throat:     Mouth: Mucous membranes are moist.     Pharynx: Oropharynx is clear. No posterior oropharyngeal erythema.  Cardiovascular:     Rate and Rhythm: Normal rate and regular rhythm.     Pulses: Normal pulses.     Heart sounds: Normal heart sounds. No murmur heard.   No gallop.  Pulmonary:     Effort: Pulmonary effort is normal.     Breath sounds: Normal breath sounds. No wheezing, rhonchi or rales.  Musculoskeletal:     Cervical back: Normal range of motion and neck supple.  Lymphadenopathy:     Cervical: No cervical adenopathy.  Skin:    General: Skin is warm and dry.      Capillary Refill: Capillary refill takes less than 2 seconds.     Findings: No erythema or rash.  Neurological:     General: No focal deficit present.     Mental Status: She is alert and oriented to person, place, and time.  Psychiatric:        Mood and Affect: Mood normal.        Thought Content: Thought content normal.        Judgment: Judgment normal.     UC Treatments / Results  Labs (all labs ordered are listed, but only abnormal results are displayed) Labs Reviewed - No data to display  EKG   Radiology No results found.  Procedures Procedures (including critical care time)  Medications Ordered in UC Medications - No data to display  Initial Impression / Assessment and Plan / UC Course  I have reviewed the triage vital signs and the nursing notes.  Pertinent labs & imaging results that were available during my care of the patient were reviewed by me and considered in my medical decision making (see chart for details).  Patient is a pleasant, nontoxic-appearing 65 year old female here for evaluation of upper respiratory complaints as outlined in the HPI above.  Patient's physical exam reveals pearly gray tympanic membranes bilaterally with a normal light reflex and clear external auditory canals.  Nasal mucosa is mildly erythematous edematous with clear nasal discharge.  Patient does have tenderness to frontal and maxillary sinuses bilaterally to percussion.  No cervical lymphadenopathy appreciated exam and oropharyngeal exam is benign.  Cardiopulmonary exam reveals clear lung sounds  in all fields.  Patient's exam is consistent with a viral URI.  Will treat with Atrovent nasal spray, Tessalon Perles, and Promethazine DM cough syrup for cough and congestion.  Patient also advised to perform sinus irrigation to help with nasal congestion and sinus pressure.   Final Clinical Impressions(s) / UC Diagnoses   Final diagnoses:  Viral URI with cough     Discharge  Instructions      Use the Atrovent nasal spray, 2 squirts in each nostril every 6 hours, as needed for runny nose and postnasal drip.  Use the Tessalon Perles every 8 hours during the day.  Take them with a small sip of water.  They may give you some numbness to the base of your tongue or a metallic taste in your mouth, this is normal.  Use the Promethazine DM cough syrup at bedtime for cough and congestion.  It will make you drowsy so do not take it during the day.  Perform sinus irrigation 2-3 times a day with a Neil[employer] Sinus Rinse kit and distilled water. Do not use tap water.   Return for reevaluation or see your primary care provider for any new or worsening symptoms.      ED Prescriptions     Medication Sig Dispense Auth. Provider   ipratropium (ATROVENT) 0.06 % nasal spray Place 2 sprays into both nostrils 4 (four) times daily. 15 mL Margarette Canada, NP   benzonatate (TESSALON) 100 MG capsule Take 2 capsules (200 mg total) by mouth every 8 (eight) hours. 21 capsule Margarette Canada, NP   promethazine-dextromethorphan (PROMETHAZINE-DM) 6.25-15 MG/5ML syrup Take 5 mLs by mouth 4 (four) times daily as needed. 118 mL Margarette Canada, NP      PDMP not reviewed this encounter.   Margarette Canada, NP 05/09/21 [phone]

## 2021-07-15 DIAGNOSIS — K222 Esophageal obstruction: Secondary | ICD-10-CM | POA: Diagnosis not present

## 2021-07-15 DIAGNOSIS — K640 First degree hemorrhoids: Secondary | ICD-10-CM | POA: Diagnosis not present

## 2021-07-15 DIAGNOSIS — Q438 Other specified congenital malformations of intestine: Secondary | ICD-10-CM | POA: Diagnosis not present

## 2021-07-15 DIAGNOSIS — R1310 Dysphagia, unspecified: Secondary | ICD-10-CM | POA: Diagnosis not present

## 2021-07-15 DIAGNOSIS — Z86010 Personal history of colonic polyps: Secondary | ICD-10-CM | POA: Diagnosis not present

## 2021-07-15 DIAGNOSIS — K449 Diaphragmatic hernia without obstruction or gangrene: Secondary | ICD-10-CM | POA: Diagnosis not present

## 2021-07-30 DIAGNOSIS — E7800 Pure hypercholesterolemia, unspecified: Secondary | ICD-10-CM | POA: Diagnosis not present

## 2021-07-30 DIAGNOSIS — I10 Essential (primary) hypertension: Secondary | ICD-10-CM | POA: Diagnosis not present

## 2021-07-30 DIAGNOSIS — R7309 Other abnormal glucose: Secondary | ICD-10-CM | POA: Diagnosis not present

## 2021-08-06 DIAGNOSIS — I10 Essential (primary) hypertension: Secondary | ICD-10-CM | POA: Diagnosis not present

## 2021-08-06 DIAGNOSIS — Z780 Asymptomatic menopausal state: Secondary | ICD-10-CM | POA: Diagnosis not present

## 2021-08-06 DIAGNOSIS — R7303 Prediabetes: Secondary | ICD-10-CM | POA: Diagnosis not present

## 2021-08-06 DIAGNOSIS — Z0000 Encounter for general adult medical examination without abnormal findings: Secondary | ICD-10-CM | POA: Diagnosis not present

## 2021-08-06 DIAGNOSIS — E7800 Pure hypercholesterolemia, unspecified: Secondary | ICD-10-CM | POA: Diagnosis not present

## 2021-08-20 DIAGNOSIS — Z1231 Encounter for screening mammogram for malignant neoplasm of breast: Secondary | ICD-10-CM | POA: Diagnosis not present

## 2021-08-20 LAB — HM MAMMOGRAPHY

## 2021-08-29 ENCOUNTER — Other Ambulatory Visit: Payer: Self-pay

## 2021-08-29 ENCOUNTER — Ambulatory Visit (INDEPENDENT_AMBULATORY_CARE_PROVIDER_SITE_OTHER): Payer: PPO | Admitting: Obstetrics and Gynecology

## 2021-08-29 ENCOUNTER — Encounter: Payer: Self-pay | Admitting: Obstetrics and Gynecology

## 2021-08-29 VITALS — BP 126/81 | HR 71 | Ht 62.0 in | Wt 220.9 lb

## 2021-08-29 DIAGNOSIS — Z6841 Body Mass Index (BMI) 40.0 and over, adult: Secondary | ICD-10-CM | POA: Diagnosis not present

## 2021-08-29 DIAGNOSIS — Z01419 Encounter for gynecological examination (general) (routine) without abnormal findings: Secondary | ICD-10-CM

## 2021-08-29 NOTE — Progress Notes (Addendum)
HPI:      [name] is a 65 y.o. [phone] who LMP was No LMP recorded. Patient has had a hysterectomy.  Subjective:   She presents today for her annual examination.  She reports that her knee continues to slowly get better.  Occasionally uses triamcinolone cream on her abdomen for a rash that she gets under her panniculus.  She says it comes and goes and she is managing it without problem.  She reports that she is up-to-date with her mammography and blood work.  She has finally been able to get her steps up as her knee slowly gets better.  She has no GYN complaints. Of significant note, patient has had a hysterectomy but it was supracervical and thus retained her cervix. She reports that she recently had an upper GI because she had difficulty swallowing pills.  She was diagnosed with a "hiatal hernia.  She has not yet had her follow-up appointment.    Hx: The following portions of the patient's history were reviewed and updated as appropriate:             She  has a past medical history of Acid reflux, Behcet's disease ([place]), Family history of anesthesia complication, Fatty liver, Hyperlipemia, Hypertension, Obesity, Schatzki's ring, Tobacco use, Vaginal atrophy, and Vulvar itching. She does not have any pertinent problems on file. She  has a past surgical history that includes Thoracic discectomy (Right, 03/14/2013); Cesarean section; Back surgery; Colonoscopy with propofol (N/A, 12/31/2015); Esophagogastroduodenoscopy (egd) with propofol (N/A, 12/31/2015); Abdominal hysterectomy; Knee arthroscopy (Right, 06/25/2020); and Knee Arthroplasty (Right, 12/05/2020). Her family history includes AAA (abdominal aortic aneurysm) in her father; Crohn's disease in her daughter; Hypertension in her father; Kidney failure in her mother; Leukemia in her mother. She  reports that she quit smoking about 4 years ago. Her smoking use included cigarettes. She has a 5.00 pack-year smoking history. She has never  used smokeless tobacco. She reports that she does not drink alcohol and does not use drugs. She has a current medication list which includes the following prescription(s): amlodipine-benazepril, celecoxib, eucrisa, hydrochlorothiazide, ipratropium, nystatin-triamcinolone, omeprazole, simvastatin, turmeric, and valacyclovir. She is allergic to ibuprofen, tylenol [acetaminophen], latex, penicillin g, and penicillins.       Review of Systems:  Review of Systems  Constitutional: Denied constitutional symptoms, night sweats, recent illness, fatigue, fever, insomnia and weight loss.  Eyes: Denied eye symptoms, eye pain, photophobia, vision change and visual disturbance.  Ears/Nose/Throat/Neck: Denied ear, nose, throat or neck symptoms, hearing loss, nasal discharge, sinus congestion and sore throat.  Cardiovascular: Denied cardiovascular symptoms, arrhythmia, chest pain/pressure, edema, exercise intolerance, orthopnea and palpitations.  Respiratory: Denied pulmonary symptoms, asthma, pleuritic pain, productive sputum, cough, dyspnea and wheezing.  Gastrointestinal: Denied, gastro-esophageal reflux, melena, nausea and vomiting.  Genitourinary: Denied genitourinary symptoms including symptomatic vaginal discharge, pelvic relaxation issues, and urinary complaints.  Musculoskeletal: Denied musculoskeletal symptoms, stiffness, swelling, muscle weakness and myalgia.  Dermatologic: Denied dermatology symptoms, rash and scar.  Neurologic: Denied neurology symptoms, dizziness, headache, neck pain and syncope.  Psychiatric: Denied psychiatric symptoms, anxiety and depression.  Endocrine: Denied endocrine symptoms including hot flashes and night sweats.   Meds:   Current Outpatient Medications on File Prior to Visit  Medication Sig Dispense Refill   amLODipine-benazepril (LOTREL) 5-20 MG per capsule Take 1 capsule by mouth at bedtime.     celecoxib (CELEBREX) 200 MG capsule Take 200 mg by mouth 2 (two) times  daily.     Crisaborole (EUCRISA) 2 % OINT  Apply 2 % topically daily as needed (irritation).     hydrochlorothiazide (HYDRODIURIL) 25 MG tablet Take 25 mg by mouth daily.     ipratropium (ATROVENT) 0.06 % nasal spray Place 2 sprays into both nostrils 4 (four) times daily. 15 mL 12   nystatin-triamcinolone (MYCOLOG II) cream Apply 1 application topically daily as needed. (Patient taking differently: Apply 1 application topically daily as needed (irritation).) 60 g 1   omeprazole (PRILOSEC) 20 MG capsule Take 20 mg by mouth daily.     simvastatin (ZOCOR) 20 MG tablet Take 20 mg by mouth at bedtime.     Turmeric 500 MG CAPS Take 500 mg by mouth daily.     valACYclovir (VALTREX) 1000 MG tablet Take 1,000 mg by mouth See admin instructions. At first sign of outbreak, take 1 tablet (1,000 mg total) by mouth twice daily for 2-5 days (less if lesion resolves).     No current facility-administered medications on file prior to visit.     Objective:     Vitals:   08/29/21 0732  BP: 126/81  Pulse: 71    Filed Weights   08/29/21 0732  Weight: 220 lb 14.4 oz (100.2 kg)              Physical examination General NAD, Conversant  HEENT Atraumatic; Op clear with mmm.  Normo-cephalic. Pupils reactive. Anicteric sclerae  Thyroid/Neck Smooth without nodularity or enlargement. Normal ROM.  Neck Supple.  Skin No rashes, lesions or ulceration. Normal palpated skin turgor. No nodularity.  Breasts: No masses or discharge.  Symmetric.  No axillary adenopathy.  Lungs: Clear to auscultation.No rales or wheezes. Normal Respiratory effort, no retractions.  Heart: NSR.  No murmurs or rubs appreciated. No periferal edema  Abdomen: Soft.  Non-tender.  No masses.  No HSM. No hernia  Extremities: Moves all appropriately.  Normal ROM for age. No lymphadenopathy.  Neuro: Oriented to PPT.  Normal mood. Normal affect.     Pelvic:   Vulva: Normal appearance.  No lesions.  Vagina: No lesions or abnormalities noted.   Support: Normal pelvic support.  Urethra No masses tenderness or scarring.  Meatus Normal size without lesions or prolapse.  Cervix: Normal appearance.  No lesions.  Anus: Normal exam.  No lesions.  Perineum: Normal exam.  No lesions.        Bimanual   Uterus: Surgically absent  Adnexae: No masses.  Non-tender to palpation.  Cul-de-sac: Negative for abnormality.     Assessment:    G3P3003 Patient Active Problem List   Diagnosis Date Noted   Chondromalacia of right knee 08/12/2020   Borderline diabetes mellitus 06/24/2018   Status post hysterectomy with oophorectomy 01/21/2017   Fatty liver 10/24/2015   GERD (gastroesophageal reflux disease) 03/10/2014   HTN (hypertension) 03/10/2014   Obesity 03/10/2014   Hypercholesterolemia 03/10/2014   Varicose veins of bilateral lower extremities with other complications 37/08/6268   HNP (herniated nucleus pulposus), thoracic 03/14/2013     1. Well woman exam with routine gynecological exam   2. Class 3 severe obesity due to excess calories without serious comorbidity with body mass index (BMI) of 40.0 to 44.9 in adult [facility])        Plan:            1.  Basic Screening Recommendations The basic screening recommendations for asymptomatic women were discussed with the patient during her visit.  The age-appropriate recommendations were discussed with her and the rational for the tests reviewed.  When I  am informed by the patient that another primary care physician has previously obtained the age-appropriate tests and they are up-to-date, only outstanding tests are ordered and referrals given as necessary.  Abnormal results of tests will be discussed with her when all of her results are completed.  Routine preventative health maintenance measures emphasized: Exercise/Diet/Weight control, Tobacco Warnings, Alcohol/Substance use risks and Stress Management Patient has aged out of Pap smears -is up-to-date on mammography and blood work. Has  DEXA scan scheduled next month. Orders No orders of the defined types were placed in this encounter.   No orders of the defined types were placed in this encounter.         F/U  Return in about 1 year (around 08/29/2022) for Annual Physical.  Finis Bud, M.D. 08/29/2021 8:03 AM

## 2021-08-30 ENCOUNTER — Encounter: Payer: Commercial Managed Care - PPO | Admitting: Obstetrics and Gynecology

## 2021-09-30 IMAGING — MR MR THORACIC SPINE W/O CM
4 of 6 series · 19 of 48 positions shown · non-contrast
Comparison: Cervical spine MRI today reported separately.

CLINICAL DATA: 63-year-old female with cervical and thoracic back
pain for 3 weeks. Fell in [REDACTED]. Bilateral arm weakness and
numbness.

EXAM:
MRI THORACIC SPINE WITHOUT CONTRAST
TECHNIQUE: Multiplanar, multisequence MR imaging of the thoracic spine was
performed. No intravenous contrast was administered.

[Series 17: T1 · sagittal · 3.0mm · 0.83mm/px · 3 of 15 slices shown]
[im 1/15]
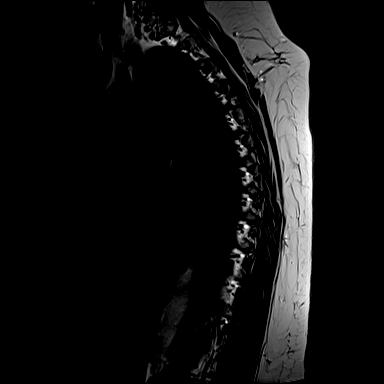
[im 8/15]
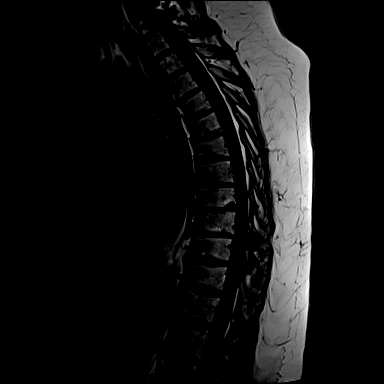
[im 15/15]
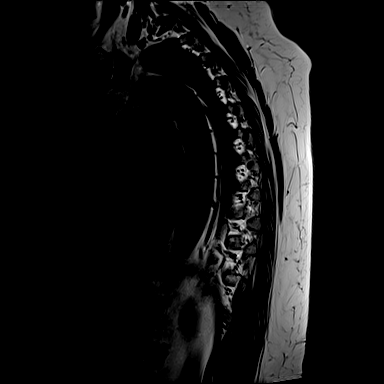

[Series 18: STIR · sagittal · 3.0mm · 1.00mm/px · 3 of 15 slices shown]
[im 1/15]
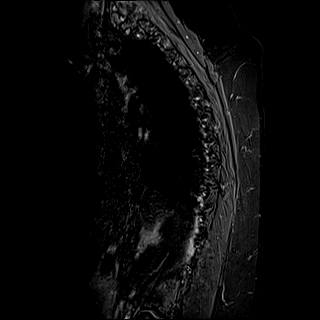
[im 8/15]
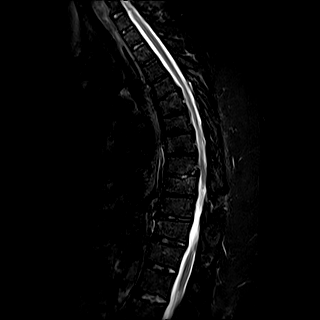
[im 15/15]
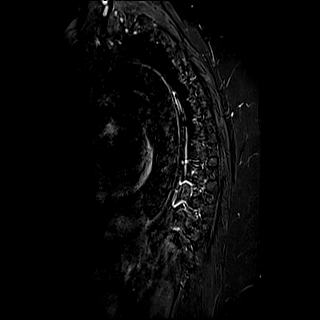

[Series 19: T2 · sagittal · 3.0mm · 0.83mm/px · 5 of 15 slices shown (1 of 2)]
[im 1/15]
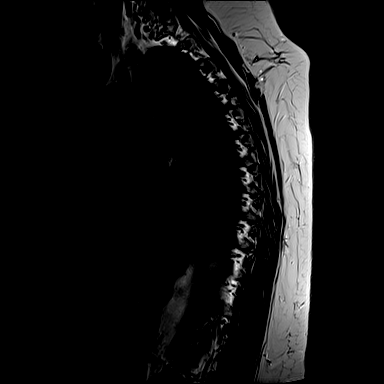
[im 4/15]
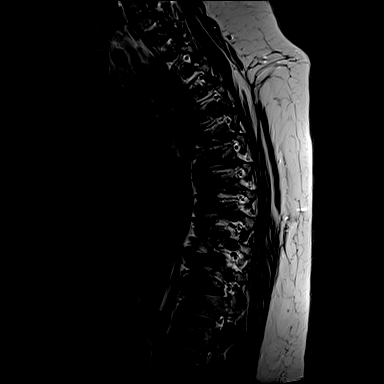
[im 8/15]
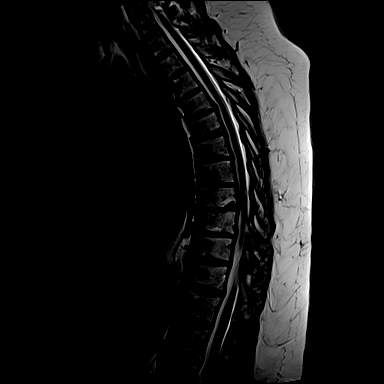
[im 11/15]
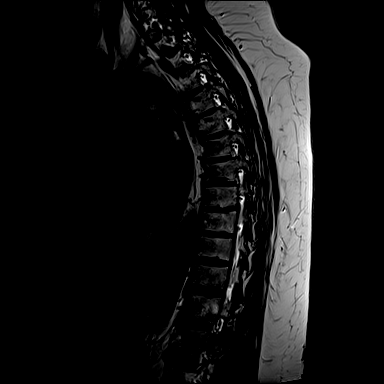
[im 15/15]
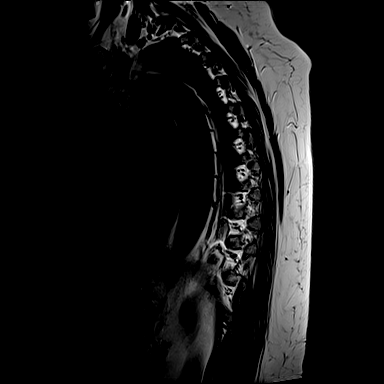

[Series 20: T2 · axial · 4.0mm · 0.28mm/px · z∈[-271,-76]mm · 8 of 39 slices shown (2 of 2)]
[im 1/39]
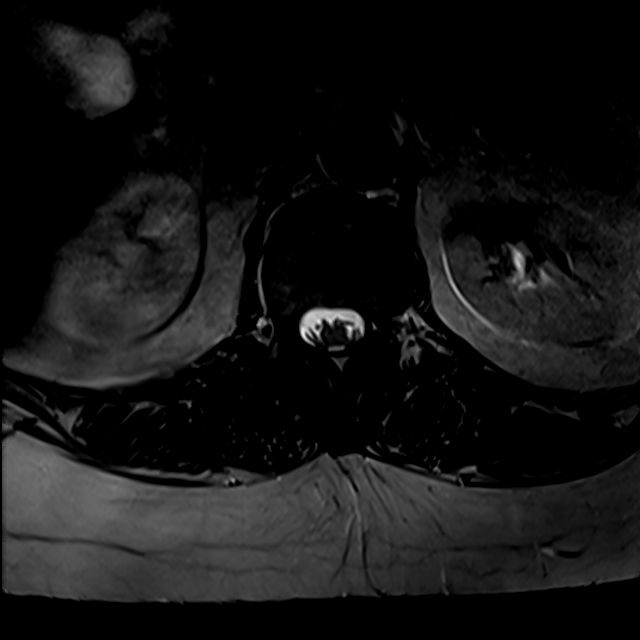
[im 6/39]
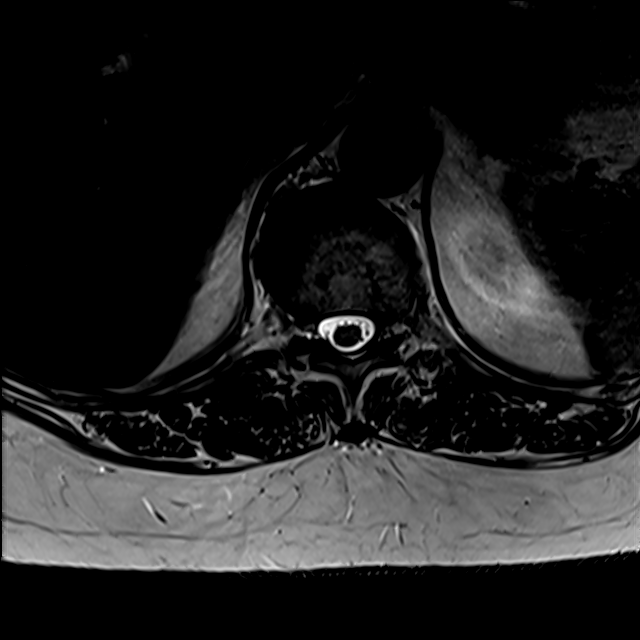
[im 12/39]
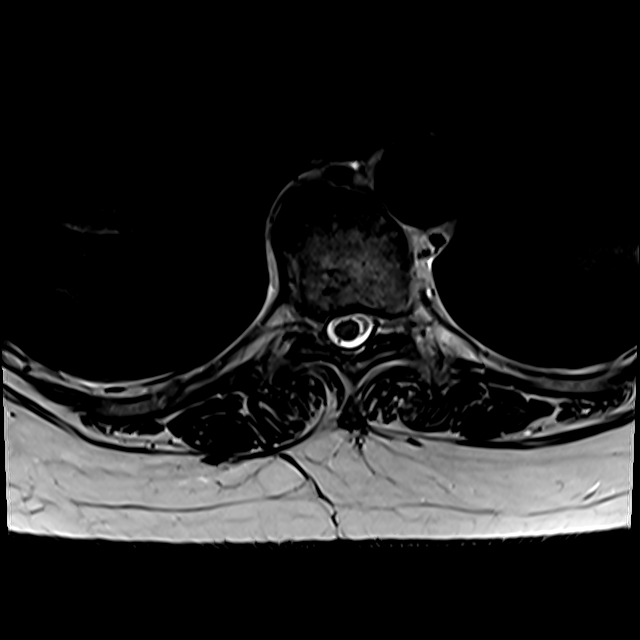
[im 18/39]
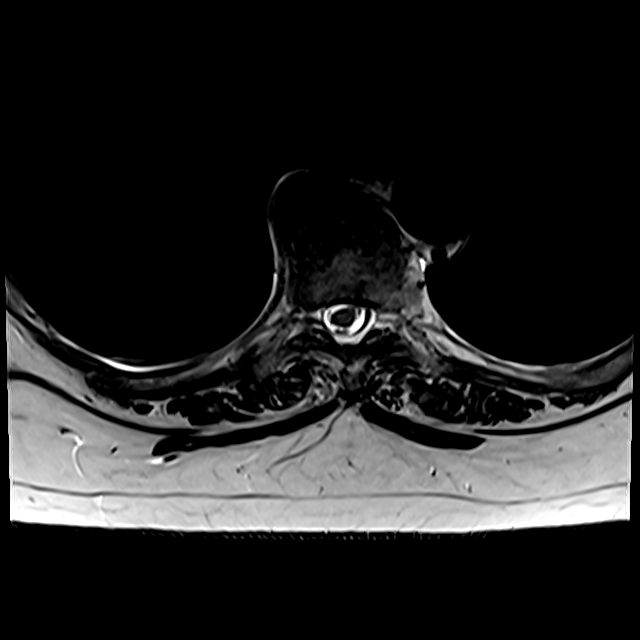
[im 21/39]
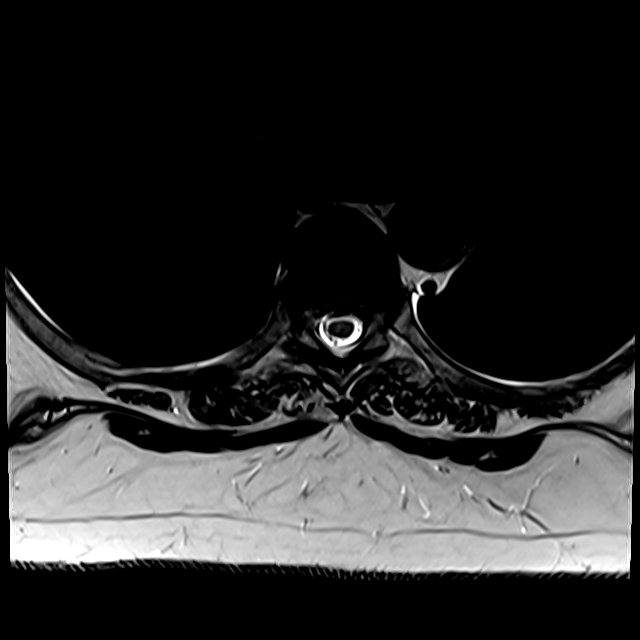
[im 27/39]
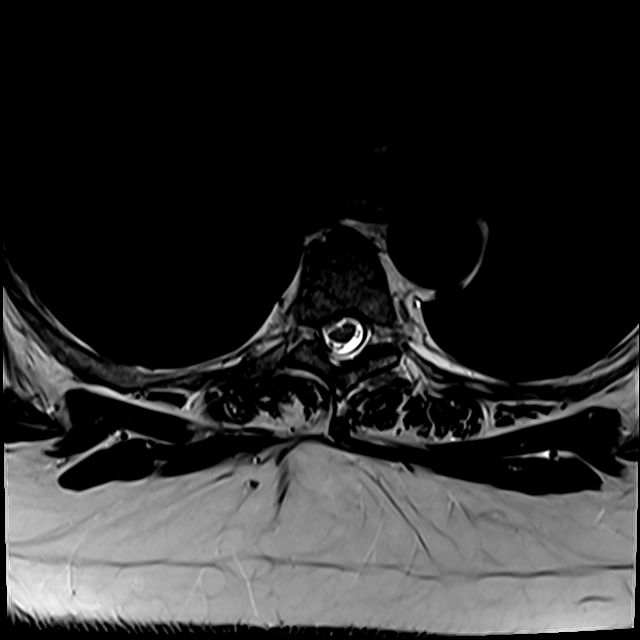
[im 33/39]
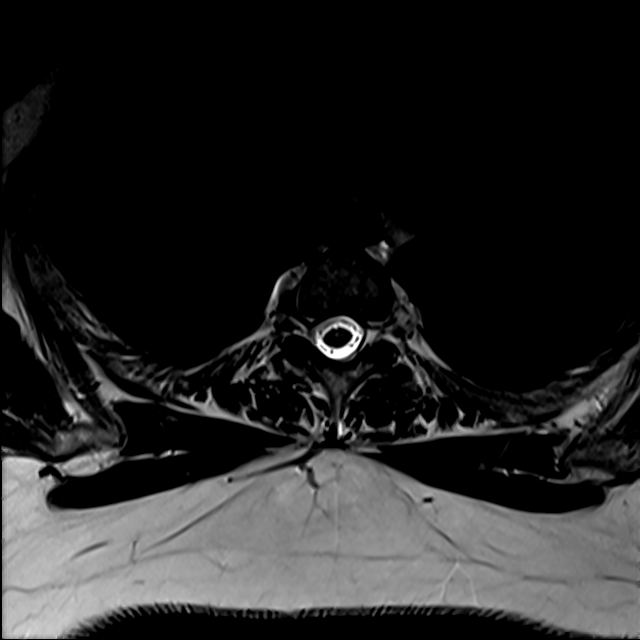
[im 39/39]
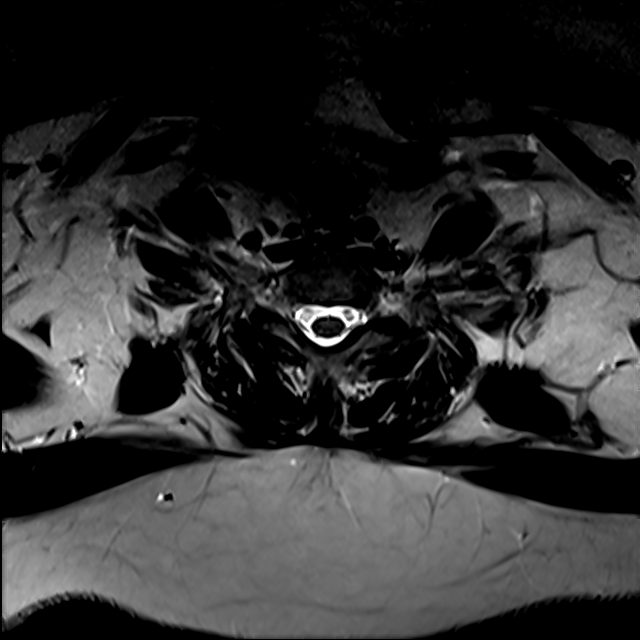

[19 of 48 positions shown; findings below may reference images not displayed]

Thoracic
CT myelogram 02/28/2013. Thoracic spine MRI report 10/22/2012 (no
images available).
FINDINGS: Limited cervical spine imaging:  Reported separately today.

Thoracic spine segmentation:  Normal on the comparison CT myelogram.

Alignment: Increased thoracic kyphosis since 3011. No
spondylolisthesis.

Vertebrae: Widespread chronic endplate degeneration from T5 through
T12 most pronounced at T8-T9. However, there is mild marrow
degenerative edema at T11-T12 eccentric to the appearing endplate
right. Underlying normal bone marrow signal with no convincing other
acute osseous abnormality.

Cord: No spinal cord signal abnormality identified despite some
degenerative spinal stenosis and spinal cord mass effect tail below.
Conus medullaris appears normal at L1.

Paraspinal and other soft tissues: Negative visible thoracic and
upper abdominal viscera negative paraspinal soft tissues aside from
a tiny focus of subcutaneous susceptibility artifact overlying the
T8-T9 level of unclear etiology and significance (series 17, image
6).

Disc levels:

T1-T2: Borderline to mild facet hypertrophy.

T2-T3: Negative.

T3-T4: Mild right side facet hypertrophy and right T3 foraminal
stenosis.

T4-T5: Chronic right paracentral disc protrusion (series 19, image
8). Questionable postoperative changes to the right posterior
elements at this level, but might reflect interval right facet
ankylosis instead. Mild spinal stenosis with some right hemi cord
mass effect. Moderate to severe right T4 foraminal stenosis. This
level appears stable since 3011.

T5-T6: More broad-based left paracentral disc protrusion (series 20,
image 17). Minimal facet hypertrophy. Mild spinal stenosis and left
hemi cord mass effect without foraminal stenosis. This level appears
stable since 3011.

T6-T7: Disc space loss but minimal disc bulge and no stenosis.

T7-T8: Disc space loss with minimal disc bulge and no stenosis.

T8-T9: Chronic disc and endplate degeneration with chronic left
paracentral protrusion (series 20, image 25). Mild spinal stenosis
and spinal cord mass effect appears stable since 3011. No foraminal
stenosis.

T9-T10: Questionable postoperative changes or some interval facet
ankylosis on the right at this level. Mild disc bulge with mostly
foraminal endplate spurring. No spinal stenosis. Chronic right T9
foraminal stenosis appears moderate to severe and stable.

T10-T11: Mild facet hypertrophy. No spinal stenosis. Mild bilateral
T10 foraminal stenosis appears stable.

T11-T12: Minimal disc bulge. Mild to moderate facet hypertrophy.
Mild bilateral T11 foraminal stenosis is probably stable.

T12-L1: Negative.
IMPRESSION: 1. Chronic thoracic disc and endplate degeneration not significantly
changed from a 3011 Thoracic CT Myelogram. There is mild
degenerative endplate marrow edema today at T11-T12 eccentric to the
right.
2. Questionable postoperative changes to the right posterior
elements of T4-T5 and T9-T10 since the prior CT. Chronic moderate to
severe right foraminal stenosis at both of those levels.
3. Otherwise stable mild multifactorial spinal stenosis at T4-T5,
T5-T6, T8-T9. Up to mild associated spinal cord mass effect but no
cord signal abnormality.

## 2021-10-07 DIAGNOSIS — K449 Diaphragmatic hernia without obstruction or gangrene: Secondary | ICD-10-CM | POA: Diagnosis not present

## 2021-10-07 DIAGNOSIS — R1319 Other dysphagia: Secondary | ICD-10-CM | POA: Diagnosis not present

## 2021-10-07 DIAGNOSIS — K219 Gastro-esophageal reflux disease without esophagitis: Secondary | ICD-10-CM | POA: Diagnosis not present

## 2021-10-07 DIAGNOSIS — K222 Esophageal obstruction: Secondary | ICD-10-CM | POA: Diagnosis not present

## 2021-12-11 DIAGNOSIS — Z96651 Presence of right artificial knee joint: Secondary | ICD-10-CM | POA: Diagnosis not present

## 2021-12-12 DIAGNOSIS — Z780 Asymptomatic menopausal state: Secondary | ICD-10-CM | POA: Diagnosis not present

## 2021-12-29 ENCOUNTER — Other Ambulatory Visit: Payer: Self-pay

## 2021-12-29 ENCOUNTER — Ambulatory Visit
Admission: EM | Admit: 2021-12-29 | Discharge: 2021-12-29 | Disposition: A | Discharge status: Home / Self Care | Payer: PPO | Attending: Emergency Medicine | Admitting: Emergency Medicine

## 2021-12-29 DIAGNOSIS — J069 Acute upper respiratory infection, unspecified: Secondary | ICD-10-CM | POA: Diagnosis not present

## 2021-12-29 MED ORDER — BENZONATATE 100 MG PO CAPS: 200.0000 mg | ORAL_CAPSULE | Freq: Three times a day (TID) | ORAL | 0 refills | Status: DC

## 2021-12-29 MED ORDER — PROMETHAZINE-DM 6.25-15 MG/5ML PO SYRP: 5.0000 mL | ORAL_SOLUTION | Freq: Four times a day (QID) | ORAL | 0 refills | Status: DC | PRN

## 2021-12-29 NOTE — Discharge Instructions (Signed)

## 2021-12-29 NOTE — ED Provider Notes (Signed)
[facility]    CSN: [id number] Arrival date & time: 12/29/21  4492      History   Chief Complaint Chief Complaint  Patient presents with   Chest Congestion    HPI Sara Rhodes is a 66 y.o. female.   HPI  66 year old female here for evaluation of respiratory complaints.  Patient reports her symptoms began 2 days ago and consist of runny nose and nasal congestion for clear nasal discharge, sore throat, ear pain on the right, and a nonproductive cough.  She denies fever, shortness of breath or wheezing, GI complaints, or body aches.  Patient did have COVID in January and states that approximately every 3 weeks that she develops some viral upper respiratory symptoms.  Past Medical History:  Diagnosis Date   Acid reflux    Behcet's disease ([place])    Family history of anesthesia complication    N/V daughter   Fatty liver    Hyperlipemia    Hypertension    Obesity    Schatzki's ring    Tobacco use    Vaginal atrophy    Vulvar itching     Patient Active Problem List   Diagnosis Date Noted   Chondromalacia of right knee 08/12/2020   Borderline diabetes mellitus 06/24/2018   Status post hysterectomy with oophorectomy 01/21/2017   Fatty liver 10/24/2015   GERD (gastroesophageal reflux disease) 03/10/2014   HTN (hypertension) 03/10/2014   Obesity 03/10/2014   Hypercholesterolemia 03/10/2014   Varicose veins of bilateral lower extremities with other complications 01/00/7121   HNP (herniated nucleus pulposus), thoracic 03/14/2013    Past Surgical History:  Procedure Laterality Date   ABDOMINAL HYSTERECTOMY     total- lsh bso- fibroids   BACK SURGERY     CESAREAN SECTION     COLONOSCOPY WITH PROPOFOL N/A 12/31/2015   Procedure: COLONOSCOPY WITH PROPOFOL;  Surgeon: Manya Silvas, MD;  Location: [place];  Service: Endoscopy;  Laterality: N/A;   ESOPHAGOGASTRODUODENOSCOPY (EGD) WITH PROPOFOL N/A 12/31/2015   Procedure: ESOPHAGOGASTRODUODENOSCOPY  (EGD) WITH PROPOFOL;  Surgeon: Manya Silvas, MD;  Location: [facility] ENDOSCOPY;  Service: Endoscopy;  Laterality: N/A;   KNEE ARTHROPLASTY Right 12/05/2020   Procedure: COMPUTER ASSISTED TOTAL KNEE ARTHROPLASTY;  Surgeon: Dereck Leep, MD;  Location: [facility];  Service: Orthopedics;  Laterality: Right;   KNEE ARTHROSCOPY Right 06/25/2020   Procedure: ARTHROSCOPY KNEE;  Surgeon: Dereck Leep, MD;  Location: [facility];  Service: Orthopedics;  Laterality: Right;   THORACIC DISCECTOMY Right 03/14/2013   Procedure: THORACIC DISCECTOMY;  Surgeon: Charlie Pitter, MD;  Location: [place] [facility];  Service: Neurosurgery;  Laterality: Right;  Thoracic four-five and thoracic nine-ten laminotomy with transpedicular microdiskectomy    OB History     Gravida  3   Para  3   Term  3   Preterm  0   AB  0   Living  3      SAB  0   IAB  0   Ectopic  0   Multiple  0   Live Births  3        Obstetric Comments  1st Menstrual Cycle: 12 1st Pregnancy: 25          Home Medications    Prior to Admission medications   Medication Sig Start Date End Date Taking? Authorizing Provider  amLODipine-benazepril (LOTREL) 5-20 MG per capsule Take 1 capsule by mouth at bedtime.   Yes [provider]  benzonatate (TESSALON) 100 MG capsule  Take 2 capsules (200 mg total) by mouth every 8 (eight) hours. 12/29/21  Yes Margarette Canada, NP  celecoxib (CELEBREX) 200 MG capsule Take 200 mg by mouth 2 (two) times daily.   Yes [provider]  Crisaborole (EUCRISA) 2 % OINT Apply 2 % topically daily as needed (irritation).   Yes [provider]  hydrochlorothiazide (HYDRODIURIL) 25 MG tablet Take 25 mg by mouth daily.   Yes [provider]  omeprazole (PRILOSEC) 20 MG capsule Take 20 mg by mouth daily.   Yes [provider]  promethazine-dextromethorphan (PROMETHAZINE-DM) 6.25-15 MG/5ML syrup Take 5 mLs by mouth 4 (four) times daily as needed. 12/29/21  Yes Margarette Canada, NP   simvastatin (ZOCOR) 20 MG tablet Take 20 mg by mouth at bedtime.   Yes [provider]  Turmeric 500 MG CAPS Take 500 mg by mouth daily.   Yes [provider]  valACYclovir (VALTREX) 1000 MG tablet Take 1,000 mg by mouth See admin instructions. At first sign of outbreak, take 1 tablet (1,000 mg total) by mouth twice daily for 2-5 days (less if lesion resolves). 08/26/19  Yes [provider]  ipratropium (ATROVENT) 0.06 % nasal spray Place 2 sprays into both nostrils 4 (four) times daily. 05/09/21   Margarette Canada, NP    Family History Family History  Problem Relation Age of Onset   Crohn's disease Daughter    Leukemia Mother    Kidney failure Mother    AAA (abdominal aortic aneurysm) Father    Hypertension Father    Cancer Neg Hx    Diabetes Neg Hx    Heart disease Neg Hx     Social History Social History   Tobacco Use   Smoking status: Former    Packs/day: 0.50    Years: 10.00    Pack years: 5.00    Types: Cigarettes    Quit date: 06/05/2017    Years since quitting: 4.5   Smokeless tobacco: Never  Vaping Use   Vaping Use: Never used  Substance Use Topics   Alcohol use: No   Drug use: No     Allergies   Ibuprofen, Tylenol [acetaminophen], Latex, Penicillin g, and Penicillins   Review of Systems Review of Systems  Constitutional:  Negative for fever.  HENT:  Positive for congestion, ear pain, rhinorrhea and sore throat.   Respiratory:  Positive for cough. Negative for shortness of breath and wheezing.   Gastrointestinal:  Negative for diarrhea, nausea and vomiting.  Musculoskeletal:  Negative for arthralgias and myalgias.  Skin:  Negative for rash.  Hematological: Negative.   Psychiatric/Behavioral: Negative.      Physical Exam Triage Vital Signs ED Triage Vitals  Enc Vitals Group     BP 12/29/21 0848 (!) 154/89     Pulse Rate 12/29/21 0848 68     Resp 12/29/21 0848 18     Temp 12/29/21 0848 98.7 F (37.1 C)     Temp Source  12/29/21 0848 Oral     SpO2 12/29/21 0848 96 %     Weight 12/29/21 0846 210 lb (95.3 kg)     Height 12/29/21 0846 5\' 2"  (1.575 m)     Head Circumference --      Peak Flow --      Pain Score 12/29/21 0845 0     Pain Loc --      Pain Edu? --      Excl. in [place]? --    No data found.  Updated Vital Signs  BP (!) 154/89 (BP Location: Right Arm)    Pulse 68    Temp 98.7 F (37.1 C) (Oral)    Resp 18    Ht 5\' 2"  (1.575 m)    Wt 210 lb (95.3 kg)    SpO2 96%    BMI 38.41 kg/m   Visual Acuity Right Eye Distance:   Left Eye Distance:   Bilateral Distance:    Right Eye Near:   Left Eye Near:    Bilateral Near:     Physical Exam Vitals and nursing note reviewed.  Constitutional:      Appearance: Normal appearance. She is not ill-appearing.  HENT:     Head: Normocephalic and atraumatic.     Right Ear: Tympanic membrane, ear canal and external ear normal. There is no impacted cerumen.     Left Ear: Tympanic membrane, ear canal and external ear normal. There is no impacted cerumen.     Nose: Congestion and rhinorrhea present.     Mouth/Throat:     Mouth: Mucous membranes are moist.     Pharynx: Oropharynx is clear. Posterior oropharyngeal erythema present.  Cardiovascular:     Rate and Rhythm: Normal rate and regular rhythm.     Pulses: Normal pulses.     Heart sounds: Normal heart sounds. No murmur heard.   No friction rub. No gallop.  Pulmonary:     Effort: Pulmonary effort is normal.     Breath sounds: Normal breath sounds. No wheezing, rhonchi or rales.  Musculoskeletal:     Cervical back: Normal range of motion and neck supple.  Lymphadenopathy:     Cervical: No cervical adenopathy.  Skin:    General: Skin is warm and dry.     Capillary Refill: Capillary refill takes less than 2 seconds.     Findings: No erythema or rash.  Neurological:     General: No focal deficit present.     Mental Status: She is alert and oriented to person, place, and time.  Psychiatric:        Mood  and Affect: Mood normal.        Behavior: Behavior normal.        Thought Content: Thought content normal.        Judgment: Judgment normal.     UC Treatments / Results  Labs (all labs ordered are listed, but only abnormal results are displayed) Labs Reviewed - No data to display  EKG   Radiology No results found.  Procedures Procedures (including critical care time)  Medications Ordered in UC Medications - No data to display  Initial Impression / Assessment and Plan / UC Course  I have reviewed the triage vital signs and the nursing notes.  Pertinent labs & imaging results that were available during my care of the patient were reviewed by me and considered in my medical decision making (see chart for details).  Patient is a nontoxic-appearing 66 year old female here for evaluation of respiratory complaints that been going on for the past 2 days as outlined in the HPI above.  On exam patient has pearly-gray tympanic membranes bilaterally with normal light reflex and clear external auditory canals.  Nasal mucosa is erythematous and edematous with scant clear discharge in both nares.  Oropharyngeal exam reveals posterior oropharyngeal erythema with clear postnasal drip.  No cervical lymphadenopathy appreciated on exam.  Cardiopulmonary exam reveals clear lung sounds in all fields.  Patient's exam is consistent with a viral URI with a cough.  She has been  using Atrovent nasal spray at home, 1 squirt twice daily, I have advised her that she can increase this to 2 squirts up to 4 times a day to help with nasal congestion and postnasal drip.  I will also write her for Tessalon Perles and Promethazine DM cough syrup.  Work note provided.   Final Clinical Impressions(s) / UC Diagnoses   Final diagnoses:  Viral URI with cough     Discharge Instructions      Use the Atrovent nasal spray, 2 squirts in each nostril every 6 hours, as needed for runny nose and postnasal drip.  Use the  Tessalon Perles every 8 hours during the day.  Take them with a small sip of water.  They may give you some numbness to the base of your tongue or a metallic taste in your mouth, this is normal.  Use the Promethazine DM cough syrup at bedtime for cough and congestion.  It will make you drowsy so do not take it during the day.  Return for reevaluation or see your primary care provider for any new or worsening symptoms.      ED Prescriptions     Medication Sig Dispense Auth. Provider   benzonatate (TESSALON) 100 MG capsule Take 2 capsules (200 mg total) by mouth every 8 (eight) hours. 21 capsule Margarette Canada, NP   promethazine-dextromethorphan (PROMETHAZINE-DM) 6.25-15 MG/5ML syrup Take 5 mLs by mouth 4 (four) times daily as needed. 118 mL Margarette Canada, NP      PDMP not reviewed this encounter.   Margarette Canada, NP 12/29/21 [phone]

## 2021-12-29 NOTE — ED Triage Notes (Signed)
Pt here with C/O chest Congestion since Friday, cough, non-productive, chills. Pt had Covid 11/2021

## 2022-01-02 ENCOUNTER — Ambulatory Visit
Admission: EM | Admit: 2022-01-02 | Discharge: 2022-01-02 | Disposition: A | Discharge status: Home / Self Care | Payer: PPO

## 2022-01-02 ENCOUNTER — Other Ambulatory Visit: Payer: Self-pay

## 2022-01-02 DIAGNOSIS — J069 Acute upper respiratory infection, unspecified: Secondary | ICD-10-CM | POA: Diagnosis not present

## 2022-01-02 MED ORDER — BENZONATATE 100 MG PO CAPS: 200.0000 mg | ORAL_CAPSULE | Freq: Three times a day (TID) | ORAL | 0 refills | Status: DC

## 2022-01-02 MED ORDER — PROMETHAZINE-DM 6.25-15 MG/5ML PO SYRP: 5.0000 mL | ORAL_SOLUTION | Freq: Four times a day (QID) | ORAL | 0 refills | Status: DC | PRN

## 2022-01-02 MED ORDER — LEVOFLOXACIN 500 MG PO TABS: 500.0000 mg | ORAL_TABLET | Freq: Every day | ORAL | 0 refills | Status: DC

## 2022-01-02 NOTE — ED Triage Notes (Signed)
Patient is here for "Followup to Sunday visit, No better". Sinus congestion, Cough remains. Now "super tired". Feel's like it is "more in chest now". No fever.  ?

## 2022-01-02 NOTE — Discharge Instructions (Signed)
Take the Levaquin daily for 7 days for treatment of your URI. ? ?Use the Atrovent nasal spray, 2 squirts in each nostril every 6 hours, as needed for runny nose and postnasal drip. ? ?Use the Tessalon Perles every 8 hours during the day.  Take them with a small sip of water.  They may give you some numbness to the base of your tongue or a metallic taste in your mouth, this is normal. ? ?Use the Promethazine DM cough syrup at bedtime for cough and congestion.  It will make you drowsy so do not take it during the day. ? ?Return for reevaluation or see your primary care provider for any new or worsening symptoms.  ?

## 2022-01-02 NOTE — ED Provider Notes (Signed)
[facility]    CSN: [id number] Arrival date & time: 01/02/22  0909      History   Chief Complaint Chief Complaint  Patient presents with   Cough   Nasal Congestion    HPI Sara Rhodes is a 66 y.o. female.   HPI  67 year old female here for reevaluation of respiratory complaints.  Patient was evaluated in this urgent care 4 days ago for a 2-day history of respiratory symptoms to include sinus pressure with nasal discharge, headache, cough.  Now she is complaining of feeling fatigued.  She has not had a fever at home denies any pain or upper teeth, denies ear pain, shortness of breath, or wheezing.  She states that her cough is only intermittently productive.  She has not looked at her nasal discharge so she does not know what it looks like.  Past Medical History:  Diagnosis Date   Acid reflux    Behcet's disease ([place])    Family history of anesthesia complication    N/V daughter   Fatty liver    Hyperlipemia    Hypertension    Obesity    Schatzki's ring    Tobacco use    Vaginal atrophy    Vulvar itching     Patient Active Problem List   Diagnosis Date Noted   Chondromalacia of right knee 08/12/2020   Borderline diabetes mellitus 06/24/2018   Status post hysterectomy with oophorectomy 01/21/2017   Fatty liver 10/24/2015   GERD (gastroesophageal reflux disease) 03/10/2014   HTN (hypertension) 03/10/2014   Obesity 03/10/2014   Hypercholesterolemia 03/10/2014   Varicose veins of bilateral lower extremities with other complications 68/09/5725   HNP (herniated nucleus pulposus), thoracic 03/14/2013    Past Surgical History:  Procedure Laterality Date   ABDOMINAL HYSTERECTOMY     total- lsh bso- fibroids   BACK SURGERY     CESAREAN SECTION     COLONOSCOPY WITH PROPOFOL N/A 12/31/2015   Procedure: COLONOSCOPY WITH PROPOFOL;  Surgeon: Manya Silvas, MD;  Location: [place];  Service: Endoscopy;  Laterality: N/A;    ESOPHAGOGASTRODUODENOSCOPY (EGD) WITH PROPOFOL N/A 12/31/2015   Procedure: ESOPHAGOGASTRODUODENOSCOPY (EGD) WITH PROPOFOL;  Surgeon: Manya Silvas, MD;  Location: [facility] ENDOSCOPY;  Service: Endoscopy;  Laterality: N/A;   KNEE ARTHROPLASTY Right 12/05/2020   Procedure: COMPUTER ASSISTED TOTAL KNEE ARTHROPLASTY;  Surgeon: Dereck Leep, MD;  Location: [facility];  Service: Orthopedics;  Laterality: Right;   KNEE ARTHROSCOPY Right 06/25/2020   Procedure: ARTHROSCOPY KNEE;  Surgeon: Dereck Leep, MD;  Location: [facility];  Service: Orthopedics;  Laterality: Right;   THORACIC DISCECTOMY Right 03/14/2013   Procedure: THORACIC DISCECTOMY;  Surgeon: Charlie Pitter, MD;  Location: [place] [facility];  Service: Neurosurgery;  Laterality: Right;  Thoracic four-five and thoracic nine-ten laminotomy with transpedicular microdiskectomy    OB History     Gravida  3   Para  3   Term  3   Preterm  0   AB  0   Living  3      SAB  0   IAB  0   Ectopic  0   Multiple  0   Live Births  3        Obstetric Comments  1st Menstrual Cycle: 12 1st Pregnancy: 25          Home Medications    Prior to Admission medications   Medication Sig Start Date End Date Taking? Authorizing Provider  amLODipine-benazepril (LOTREL) 5-20 MG  capsule Take 1 capsule by mouth daily. 11/25/21  Yes [provider]  hydrochlorothiazide (HYDRODIURIL) 25 MG tablet Take 25 mg by mouth daily.   Yes [provider]  ipratropium (ATROVENT) 0.06 % nasal spray Place 2 sprays into both nostrils 4 (four) times daily. 05/09/21  Yes Margarette Canada, NP  levofloxacin (LEVAQUIN) 500 MG tablet Take 1 tablet (500 mg total) by mouth daily. 01/02/22  Yes Margarette Canada, NP  omeprazole (PRILOSEC) 20 MG capsule Take 20 mg by mouth daily.   Yes [provider]  simvastatin (ZOCOR) 20 MG tablet Take 1 tablet by mouth at bedtime. 09/05/21  Yes [provider]  Turmeric 500 MG CAPS Take 500 mg by mouth daily.   Yes  [provider]  benzonatate (TESSALON) 100 MG capsule Take 2 capsules (200 mg total) by mouth every 8 (eight) hours. 01/02/22   Margarette Canada, NP  celecoxib (CELEBREX) 200 MG capsule Take 200 mg by mouth 2 (two) times daily.    [provider]  Crisaborole (EUCRISA) 2 % OINT Apply 2 % topically daily as needed (irritation).    [provider]  LAGEVRIO 200 MG CAPS capsule Take 4 capsules by mouth 2 (two) times daily. 11/27/21   [provider]  promethazine-dextromethorphan (PROMETHAZINE-DM) 6.25-15 MG/5ML syrup Take 5 mLs by mouth 4 (four) times daily as needed. 01/02/22   Margarette Canada, NP  valACYclovir (VALTREX) 1000 MG tablet Take 1,000 mg by mouth See admin instructions. At first sign of outbreak, take 1 tablet (1,000 mg total) by mouth twice daily for 2-5 days (less if lesion resolves). 08/26/19   [provider]    Family History Family History  Problem Relation Age of Onset   Crohn's disease Daughter    Leukemia Mother    Kidney failure Mother    AAA (abdominal aortic aneurysm) Father    Hypertension Father    Cancer Neg Hx    Diabetes Neg Hx    Heart disease Neg Hx     Social History Social History   Tobacco Use   Smoking status: Former    Packs/day: 0.50    Years: 10.00    Pack years: 5.00    Types: Cigarettes    Quit date: 06/05/2017    Years since quitting: 4.5   Smokeless tobacco: Never  Vaping Use   Vaping Use: Never used  Substance Use Topics   Alcohol use: No   Drug use: No     Allergies   Ibuprofen, Tylenol [acetaminophen], Latex, Penicillin g, and Penicillins   Review of Systems Review of Systems  Constitutional:  Positive for fatigue. Negative for fever.  HENT:  Positive for congestion, rhinorrhea and sinus pressure. Negative for ear pain and sore throat.   Respiratory:  Positive for cough. Negative for shortness of breath and wheezing.   Neurological:  Positive for headaches.  Hematological: Negative.    Psychiatric/Behavioral: Negative.      Physical Exam Triage Vital Signs ED Triage Vitals  Enc Vitals Group     BP 01/02/22 0920 129/83     Pulse Rate 01/02/22 0920 77     Resp 01/02/22 0920 20     Temp 01/02/22 0920 98.3 F (36.8 C)     Temp Source 01/02/22 0920 Oral     SpO2 01/02/22 0920 100 %     Weight --      Height --      Head Circumference --      Peak Flow --  Pain Score 01/02/22 0922 3     Pain Loc --      Pain Edu? --      Excl. in [place]? --    No data found.  Updated Vital Signs BP 129/83 (BP Location: Left Arm)    Pulse 77    Temp 98.3 F (36.8 C) (Oral)    Resp 20    SpO2 100%   Visual Acuity Right Eye Distance:   Left Eye Distance:   Bilateral Distance:    Right Eye Near:   Left Eye Near:    Bilateral Near:     Physical Exam Vitals and nursing note reviewed.  Constitutional:      Appearance: Normal appearance. She is not ill-appearing.  HENT:     Head: Normocephalic and atraumatic.     Right Ear: Tympanic membrane, ear canal and external ear normal. There is no impacted cerumen.     Left Ear: Tympanic membrane, ear canal and external ear normal. There is no impacted cerumen.     Nose: Congestion and rhinorrhea present.     Mouth/Throat:     Mouth: Mucous membranes are moist.     Pharynx: Oropharynx is clear. No posterior oropharyngeal erythema.  Cardiovascular:     Rate and Rhythm: Normal rate and regular rhythm.     Pulses: Normal pulses.     Heart sounds: Normal heart sounds. No murmur heard.   No friction rub. No gallop.  Pulmonary:     Effort: Pulmonary effort is normal.     Breath sounds: Normal breath sounds. No wheezing, rhonchi or rales.  Musculoskeletal:     Cervical back: Normal range of motion and neck supple.  Lymphadenopathy:     Cervical: No cervical adenopathy.  Skin:    General: Skin is warm and dry.     Capillary Refill: Capillary refill takes less than 2 seconds.     Findings: No erythema or rash.  Neurological:      General: No focal deficit present.     Mental Status: She is alert and oriented to person, place, and time.  Psychiatric:        Mood and Affect: Mood normal.        Behavior: Behavior normal.        Thought Content: Thought content normal.        Judgment: Judgment normal.     UC Treatments / Results  Labs (all labs ordered are listed, but only abnormal results are displayed) Labs Reviewed - No data to display  EKG   Radiology No results found.  Procedures Procedures (including critical care time)  Medications Ordered in UC Medications - No data to display  Initial Impression / Assessment and Plan / UC Course  I have reviewed the triage vital signs and the nursing notes.  Pertinent labs & imaging results that were available during my care of the patient were reviewed by me and considered in my medical decision making (see chart for details).  Patient is a nontoxic-appearing 66 year old female here for reevaluation of respiratory complaints that have been going on for 7 days at this point.  Patient has had no change to her presentation other than now she is feeling fatigued.  She remains afebrile.  On exam she has pearly-gray tympanic membranes bilaterally with normal light reflex and clear external auditory canals.  Nasal mucosa is erythematous and mildly edematous.  No significant discharge noted in any nare.  Patient does have tender to percussion of the right  maxillary sinus but the frontal and maxillary sinus on the left are free of tenderness to percussion.  Oropharyngeal exam is benign.  No cervical lymphadenopathy appreciable exam.  Cardiopulmonary exam reveals clear lung sounds in all fields.  Given patient's continued symptoms and the onset of fatigue we will do a trial of antibiotics with Levaquin once daily for 7 days.  I have refilled her Tessalon Perles and Promethazine DM cough syrup as well.  Patient advised to return, or see your PCP, for continued  symptoms.   Final Clinical Impressions(s) / UC Diagnoses   Final diagnoses:  Upper respiratory tract infection, unspecified type     Discharge Instructions      Take the Levaquin daily for 7 days for treatment of your URI.  Use the Atrovent nasal spray, 2 squirts in each nostril every 6 hours, as needed for runny nose and postnasal drip.  Use the Tessalon Perles every 8 hours during the day.  Take them with a small sip of water.  They may give you some numbness to the base of your tongue or a metallic taste in your mouth, this is normal.  Use the Promethazine DM cough syrup at bedtime for cough and congestion.  It will make you drowsy so do not take it during the day.  Return for reevaluation or see your primary care provider for any new or worsening symptoms.      ED Prescriptions     Medication Sig Dispense Auth. Provider   benzonatate (TESSALON) 100 MG capsule Take 2 capsules (200 mg total) by mouth every 8 (eight) hours. 21 capsule Margarette Canada, NP   promethazine-dextromethorphan (PROMETHAZINE-DM) 6.25-15 MG/5ML syrup Take 5 mLs by mouth 4 (four) times daily as needed. 118 mL Margarette Canada, NP   levofloxacin (LEVAQUIN) 500 MG tablet Take 1 tablet (500 mg total) by mouth daily. 7 tablet Margarette Canada, NP      PDMP not reviewed this encounter.   Margarette Canada, NP 01/02/22 [phone]

## 2022-01-28 DIAGNOSIS — E7800 Pure hypercholesterolemia, unspecified: Secondary | ICD-10-CM | POA: Diagnosis not present

## 2022-01-28 DIAGNOSIS — R7303 Prediabetes: Secondary | ICD-10-CM | POA: Diagnosis not present

## 2022-01-31 DIAGNOSIS — I10 Essential (primary) hypertension: Secondary | ICD-10-CM | POA: Diagnosis not present

## 2022-01-31 DIAGNOSIS — R7303 Prediabetes: Secondary | ICD-10-CM | POA: Diagnosis not present

## 2022-01-31 DIAGNOSIS — K219 Gastro-esophageal reflux disease without esophagitis: Secondary | ICD-10-CM | POA: Diagnosis not present

## 2022-01-31 DIAGNOSIS — E7800 Pure hypercholesterolemia, unspecified: Secondary | ICD-10-CM | POA: Diagnosis not present

## 2022-02-12 DIAGNOSIS — L570 Actinic keratosis: Secondary | ICD-10-CM | POA: Diagnosis not present

## 2022-02-12 DIAGNOSIS — L578 Other skin changes due to chronic exposure to nonionizing radiation: Secondary | ICD-10-CM | POA: Diagnosis not present

## 2022-02-12 DIAGNOSIS — D225 Melanocytic nevi of trunk: Secondary | ICD-10-CM | POA: Diagnosis not present

## 2022-02-12 DIAGNOSIS — L821 Other seborrheic keratosis: Secondary | ICD-10-CM | POA: Diagnosis not present

## 2022-03-18 IMAGING — US US EXTREM LOW VENOUS*R*
1 series · 14 of 24 positions shown · non-contrast
Comparison: None.

CLINICAL DATA: Right popliteal fossa pain

EXAM:
RIGHT LOWER EXTREMITY VENOUS DOPPLER ULTRASOUND
TECHNIQUE: Gray-scale sonography with compression, as well as color and duplex
ultrasound, were performed to evaluate the deep venous system(s)
from the level of the common femoral vein through the popliteal and
proximal calf veins.

[Series 1: us venous img lower uni right (dvt) · portal-venous · 14 of 28 slices shown]
[im 1/28]
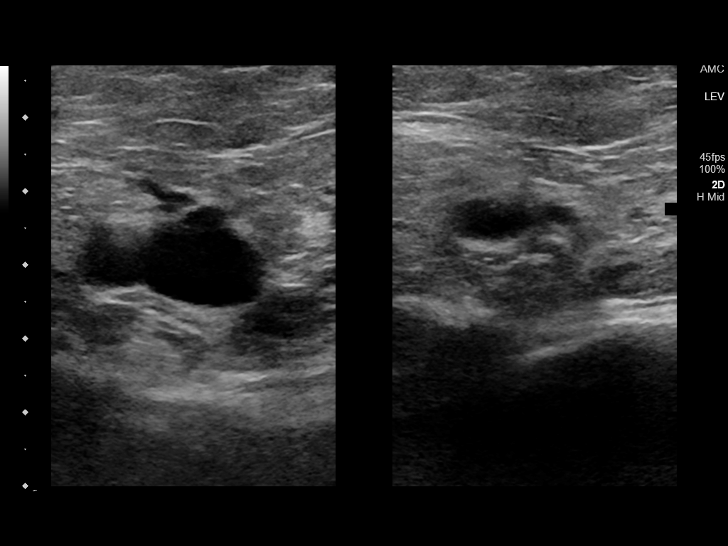
[im 3/28]
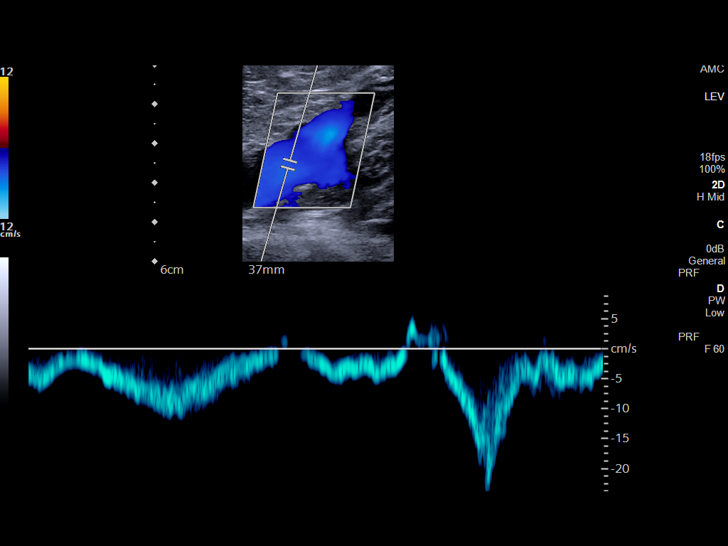
[im 5/28]
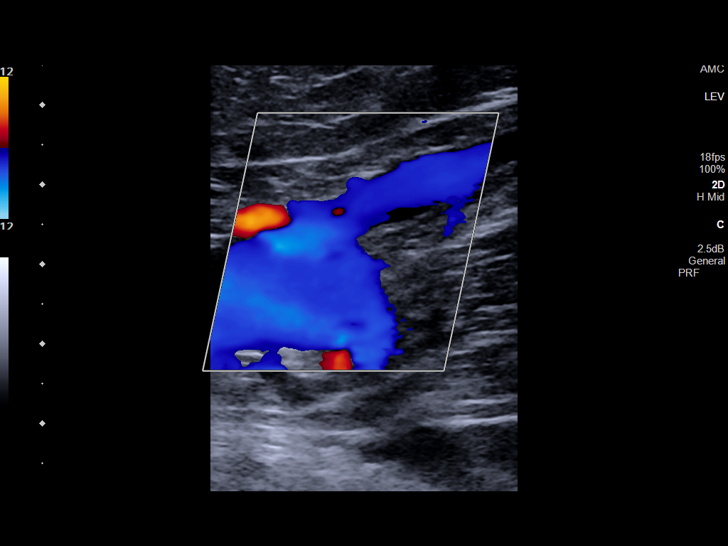
[im 8/28]
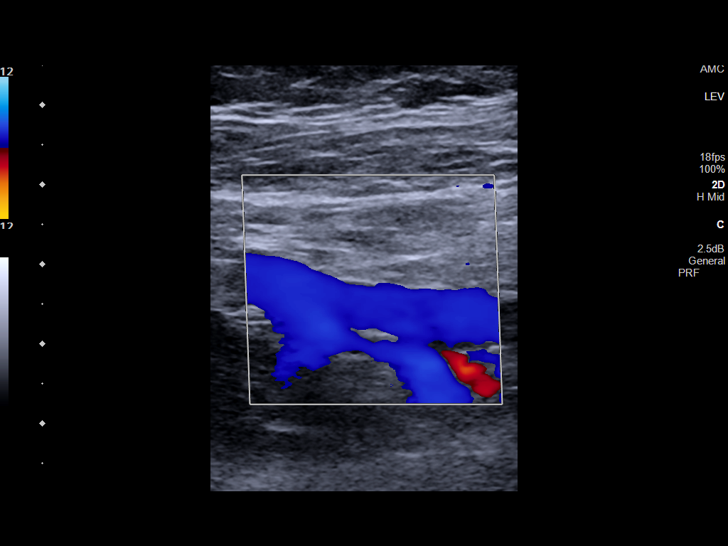
[im 9/28]
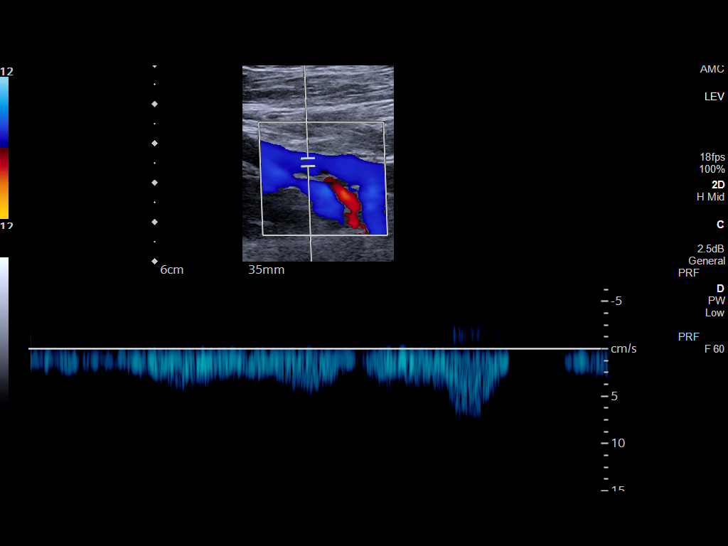
[im 11/28]
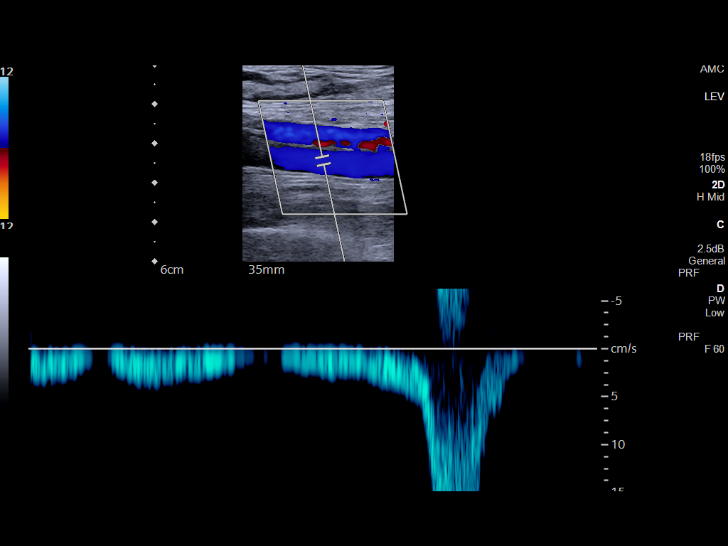
[im 13/28]
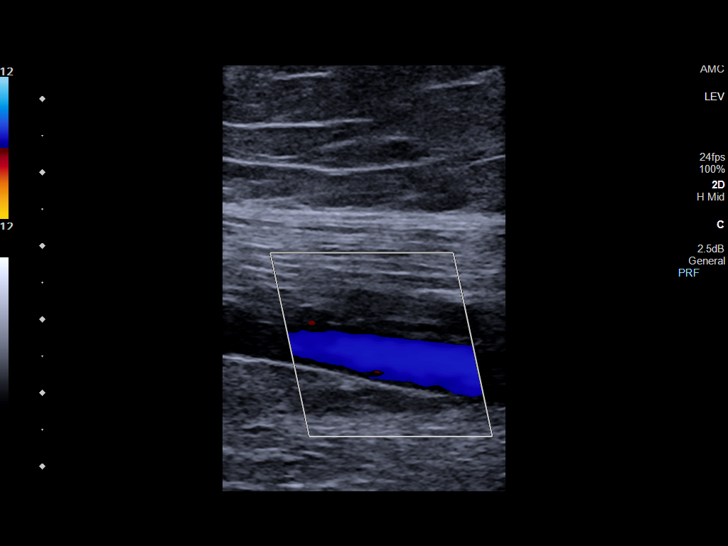
[im 15/28]
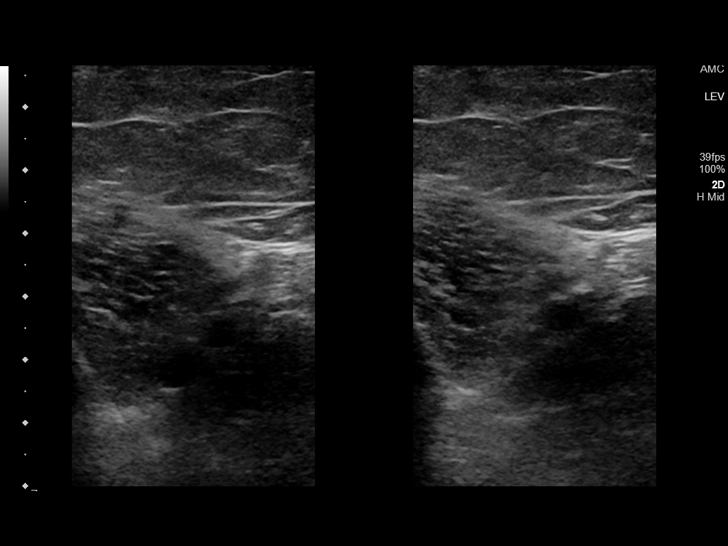
[im 17/28]
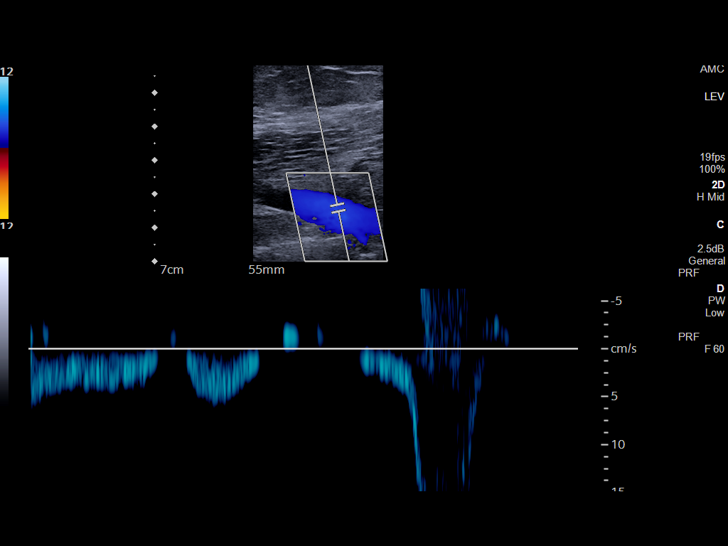
[im 19/28]
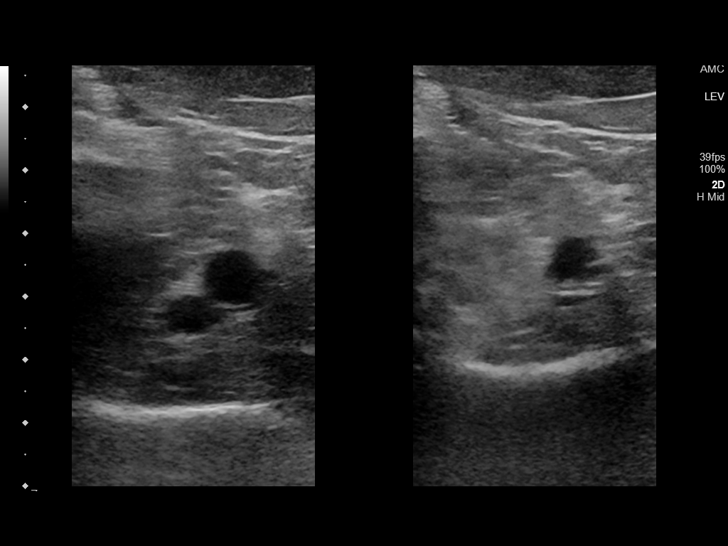
[im 22/28]
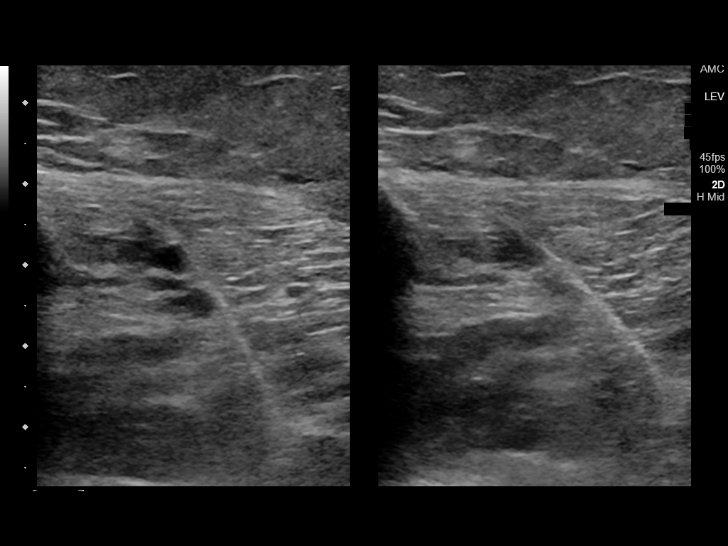
[im 23/28]
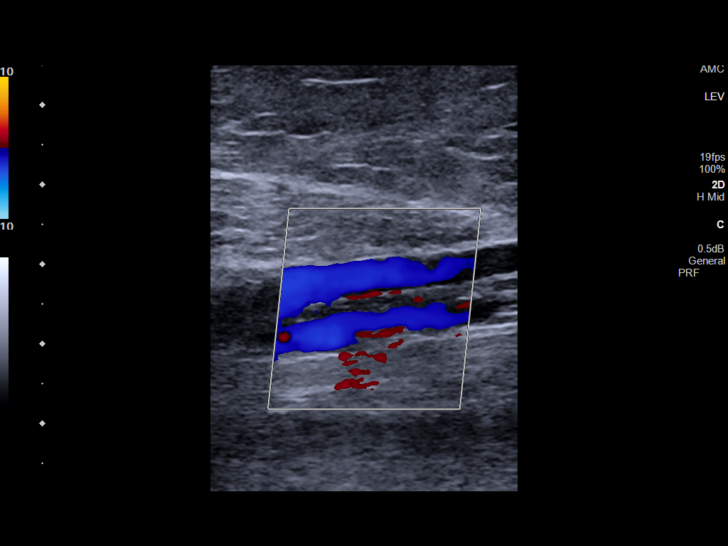
[im 25/28]
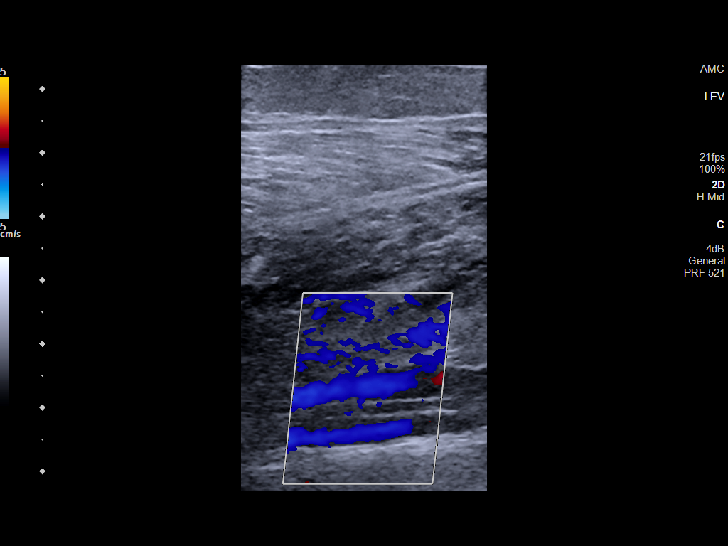
[im 28/28]
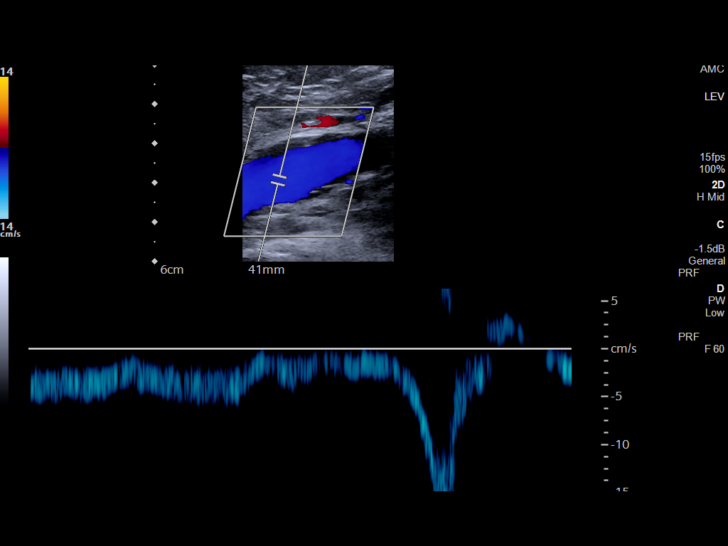

[14 of 24 positions shown; findings below may reference images not displayed]

FINDINGS: VENOUS

Normal compressibility of the common femoral, superficial femoral,
and popliteal veins, as well as the visualized calf veins.
Visualized portions of profunda femoral vein and great saphenous
vein unremarkable. No filling defects to suggest DVT on grayscale or
color Doppler imaging. Doppler waveforms show normal direction of
venous flow, normal respiratory plasticity and response to
augmentation.

Limited views of the contralateral common femoral vein are
unremarkable.

OTHER

None.

Limitations: none
IMPRESSION: Negative.

## 2022-05-17 ENCOUNTER — Encounter: Payer: Self-pay | Admitting: Emergency Medicine

## 2022-05-17 ENCOUNTER — Ambulatory Visit
Admission: EM | Admit: 2022-05-17 | Discharge: 2022-05-17 | Disposition: A | Discharge status: Home / Self Care | Payer: PPO | Attending: Emergency Medicine | Admitting: Emergency Medicine

## 2022-05-17 DIAGNOSIS — J069 Acute upper respiratory infection, unspecified: Secondary | ICD-10-CM

## 2022-05-17 MED ORDER — PROMETHAZINE-DM 6.25-15 MG/5ML PO SYRP: 5.0000 mL | ORAL_SOLUTION | Freq: Four times a day (QID) | ORAL | 0 refills | Status: DC | PRN

## 2022-05-17 MED ORDER — BENZONATATE 100 MG PO CAPS: 200.0000 mg | ORAL_CAPSULE | Freq: Three times a day (TID) | ORAL | 0 refills | Status: DC

## 2022-05-17 MED ORDER — DOXYCYCLINE HYCLATE 100 MG PO CAPS: 100.0000 mg | ORAL_CAPSULE | Freq: Two times a day (BID) | ORAL | 0 refills | Status: DC

## 2022-05-17 MED ORDER — IPRATROPIUM BROMIDE 0.06 % NA SOLN: 2.0000 | Freq: Four times a day (QID) | NASAL | 12 refills | Status: DC

## 2022-05-17 NOTE — ED Triage Notes (Signed)
Patient c/o cough and chest congestion for the past 10 days.  Patient also reports sinus pressure.

## 2022-05-17 NOTE — Discharge Instructions (Signed)
The Doxycyline twice daily with food for 10 days for treatment of your URI.  Perform sinus irrigation 2-3 times a day with a NeilMed sinus rinse kit and distilled water.  Do not use tap water.  You can use plain over-the-counter Mucinex every 6 hours to break up the stickiness of the mucus so your body can clear it.  Increase your oral fluid intake to thin out your mucus so that is also able for your body to clear more easily.  Take an over-the-counter probiotic, such as Culturelle-align-activia, 1 hour after each dose of antibiotic to prevent diarrhea.  Use the Atrovent nasal spray, 2 squirts in each nostril every 6 hours, as needed for runny nose and postnasal drip.  Use the Tessalon Perles every 8 hours during the day.  Take them with a small sip of water.  They may give you some numbness to the base of your tongue or a metallic taste in your mouth, this is normal.  Use the Promethazine DM cough syrup at bedtime for cough and congestion.  It will make you drowsy so do not take it during the day.  If you develop any new or worsening symptoms return for reevaluation or see your primary care provider.

## 2022-05-17 NOTE — ED Provider Notes (Signed)
[facility]    CSN: [id number] Arrival date & time: 05/17/22  0800      History   Chief Complaint Chief Complaint  Patient presents with   Cough    HPI Sara Rhodes is a 66 y.o. female.   HPI  46 old female here for evaluation of respiratory symptoms.  Patient reports over the last 10 days she has been experiencing right-sided sinus pressure, postnasal drip, and a nonproductive cough.  She states that when her symptoms were started she had a fever with a Tmax of 100 but that has resolved.  She denies any nasal discharge, ear pain, sore throat, shortness of breath, or wheezing.  No known sick contacts.  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   Borderline diabetes mellitus 06/24/2018   Status post hysterectomy with oophorectomy 01/21/2017   Fatty liver 10/24/2015   GERD (gastroesophageal reflux disease) 03/10/2014   HTN (hypertension) 03/10/2014   Obesity 03/10/2014   Hypercholesterolemia 03/10/2014   Varicose veins of bilateral lower extremities with other complications 68/34/1962   HNP (herniated nucleus pulposus), thoracic 03/14/2013    Past Surgical History:  Procedure Laterality Date   ABDOMINAL HYSTERECTOMY     total- lsh bso- fibroids   BACK SURGERY     CESAREAN SECTION     COLONOSCOPY WITH PROPOFOL N/A 12/31/2015   Procedure: COLONOSCOPY WITH PROPOFOL;  Surgeon: Manya Silvas, MD;  Location: [place];  Service: Endoscopy;  Laterality: N/A;   ESOPHAGOGASTRODUODENOSCOPY (EGD) WITH PROPOFOL N/A 12/31/2015   Procedure: ESOPHAGOGASTRODUODENOSCOPY (EGD) WITH PROPOFOL;  Surgeon: Manya Silvas, MD;  Location:  [facility] ENDOSCOPY;  Service: Endoscopy;  Laterality: N/A;   KNEE ARTHROPLASTY Right 12/05/2020   Procedure: COMPUTER ASSISTED TOTAL KNEE ARTHROPLASTY;  Surgeon: Dereck Leep, MD;  Location: [facility]S;  Service: Orthopedics;  Laterality: Right;   KNEE ARTHROSCOPY Right 06/25/2020   Procedure: ARTHROSCOPY KNEE;  Surgeon: Dereck Leep, MD;  Location: [facility] ORS;  Service: Orthopedics;  Laterality: Right;   THORACIC DISCECTOMY Right 03/14/2013   Procedure: THORACIC DISCECTOMY;  Surgeon: Charlie Pitter, MD;  Location: [place] [facility];  Service: Neurosurgery;  Laterality: Right;  Thoracic four-five and thoracic nine-ten laminotomy with transpedicular microdiskectomy    OB History     Gravida  3   Para  3   Term  3   Preterm  0   AB  0   Living  3      SAB  0   IAB  0   Ectopic  0   Multiple  0   Live Births  3        Obstetric Comments  1st Menstrual Cycle: 12 1st Pregnancy: 25          Home Medications    Prior to Admission medications   Medication Sig Start Date End Date Taking? Authorizing Provider  amLODipine-benazepril (LOTREL) 5-20 MG capsule Take 1 capsule by mouth daily. 11/25/21  Yes [provider]  benzonatate (TESSALON) 100 MG capsule Take 2 capsules (200 mg total) by  mouth every 8 (eight) hours. 05/17/22  Yes Margarette Canada, NP  doxycycline (VIBRAMYCIN) 100 MG capsule Take 1 capsule (100 mg total) by mouth 2 (two) times daily. 05/17/22  Yes Margarette Canada, NP  hydrochlorothiazide (HYDRODIURIL) 25 MG tablet Take 25 mg by mouth daily.   Yes [provider]  ipratropium (ATROVENT) 0.06 % nasal spray Place 2 sprays into both nostrils 4 (four) times daily. 05/17/22  Yes Margarette Canada, NP  promethazine-dextromethorphan (PROMETHAZINE-DM) 6.25-15 MG/5ML syrup Take 5 mLs by mouth 4 (four) times daily as needed. 05/17/22  Yes Margarette Canada, NP  simvastatin (ZOCOR) 20 MG tablet Take 1 tablet by mouth at bedtime. 09/05/21  Yes [provider]  celecoxib  (CELEBREX) 200 MG capsule Take 200 mg by mouth 2 (two) times daily.    [provider]  Crisaborole (EUCRISA) 2 % OINT Apply 2 % topically daily as needed (irritation).    [provider]  LAGEVRIO 200 MG CAPS capsule Take 4 capsules by mouth 2 (two) times daily. 11/27/21   [provider]  omeprazole (PRILOSEC) 20 MG capsule Take 20 mg by mouth daily.    [provider]  valACYclovir (VALTREX) 1000 MG tablet Take 1,000 mg by mouth See admin instructions. At first sign of outbreak, take 1 tablet (1,000 mg total) by mouth twice daily for 2-5 days (less if lesion resolves). 08/26/19   [provider]    Family History Family History  Problem Relation Age of Onset   Crohn's disease Daughter    Leukemia Mother    Kidney failure Mother    AAA (abdominal aortic aneurysm) Father    Hypertension Father    Cancer Neg Hx    Diabetes Neg Hx    Heart disease Neg Hx     Social History Social History   Tobacco Use   Smoking status: Former    Packs/day: 0.50    Years: 10.00    Total pack years: 5.00    Types: Cigarettes    Quit date: 06/05/2017    Years since quitting: 4.9   Smokeless tobacco: Never  Vaping Use   Vaping Use: Never used  Substance Use Topics   Alcohol use: No   Drug use: No     Allergies   Ibuprofen, Tylenol [acetaminophen], Latex, Penicillin g, and Penicillins   Review of Systems Review of Systems  Constitutional:  Positive for fever.  HENT:  Positive for congestion, postnasal drip and sinus pressure. Negative for ear pain, rhinorrhea and sore throat.   Respiratory:  Positive for cough. Negative for shortness of breath and wheezing.   Hematological: Negative.   Psychiatric/Behavioral: Negative.       Physical Exam Triage Vital Signs ED Triage Vitals  Enc Vitals Group     BP 05/17/22 0809 134/75     Pulse Rate 05/17/22 0809 77     Resp 05/17/22 0809 15     Temp 05/17/22 0809 97.9 F (36.6 C)     Temp Source  05/17/22 0809 Oral     SpO2 05/17/22 0809 97 %     Weight 05/17/22 0807 210 lb (95.3 kg)     Height 05/17/22 0807 '5\' 2"'  (1.575 m)     Head Circumference --      Peak Flow --      Pain Score 05/17/22 0807 6     Pain Loc --      Pain Edu? --      Excl. in GC? --    No  data found.  Updated Vital Signs BP 134/75 (BP Location: Left Arm)   Pulse 77   Temp 97.9 F (36.6 C) (Oral)   Resp 15   Ht '5\' 2"'  (1.575 m)   Wt 210 lb (95.3 kg)   SpO2 97%   BMI 38.41 kg/m   Visual Acuity Right Eye Distance:   Left Eye Distance:   Bilateral Distance:    Right Eye Near:   Left Eye Near:    Bilateral Near:     Physical Exam Vitals and nursing note reviewed.  Constitutional:      Appearance: Normal appearance. She is not ill-appearing.  HENT:     Head: Normocephalic and atraumatic.     Right Ear: Ear canal and external ear normal. There is no impacted cerumen.     Left Ear: Tympanic membrane, ear canal and external ear normal. There is no impacted cerumen.     Nose: Congestion and rhinorrhea present.     Mouth/Throat:     Mouth: Mucous membranes are moist.     Pharynx: Oropharynx is clear. No posterior oropharyngeal erythema.  Cardiovascular:     Rate and Rhythm: Normal rate and regular rhythm.     Pulses: Normal pulses.     Heart sounds: Normal heart sounds. No murmur heard.    No friction rub. No gallop.  Pulmonary:     Effort: Pulmonary effort is normal.     Breath sounds: Normal breath sounds. No wheezing, rhonchi or rales.  Musculoskeletal:     Cervical back: Normal range of motion and neck supple.  Lymphadenopathy:     Cervical: No cervical adenopathy.  Skin:    General: Skin is warm and dry.     Capillary Refill: Capillary refill takes less than 2 seconds.     Findings: No erythema or rash.  Neurological:     General: No focal deficit present.     Mental Status: She is alert and oriented to person, place, and time.  Psychiatric:        Mood and Affect: Mood normal.         Behavior: Behavior normal.        Thought Content: Thought content normal.        Judgment: Judgment normal.      UC Treatments / Results  Labs (all labs ordered are listed, but only abnormal results are displayed) Labs Reviewed - No data to display  EKG   Radiology No results found.  Procedures Procedures (including critical care time)  Medications Ordered in UC Medications - No data to display  Initial Impression / Assessment and Plan / UC Course  I have reviewed the triage vital signs and the nursing notes.  Pertinent labs & imaging results that were available during my care of the patient were reviewed by me and considered in my medical decision making (see chart for details).  Patient is a very pleasant, nontoxic-appearing 22 old female here for evaluation respiratory complaints as outlined HPI above.  On physical exam patient has a [place] tympanic membrane on the left with normal light reflex and clear external auditory canal.  The right tympanic membrane is erythematous around the rim.  There is no effusion noted.  The external auditory canal is also clear.  Nasal mucosa is erythematous and mildly edematous with scant yellow dried drainage in both nares.  No tenderness to percussion of frontal or maxillary sinuses.  Oropharyngeal exam is benign.  No cervical lymphadenopathy appreciated on exam.  Cardiopulmonary symptoms  S1-S2 heart sounds with regular rate and rhythm and lung sounds are clear to auscultation all fields.  Patient exam is consistent with upper respiratory infection.  Given that she has had symptoms for 10 days I think a trial of antibiotics is warranted.  She has an allergy to penicillins so I will try a round of doxycycline twice daily for 10 days.  I have also discussed sinus irrigation with her and given her instructions on how to perform that.  I will prescribe Atrovent nasal spray to help with the nasal congestion and postnasal drip and then Tessalon  Perles to use during the day for cough and Promethazine DM cough syrup to use at bedtime.  Patient has been on both these medications before without any difficulty.  Return precautions reviewed.   Final Clinical Impressions(s) / UC Diagnoses   Final diagnoses:  Upper respiratory tract infection, unspecified type     Discharge Instructions      The Doxycyline twice daily with food for 10 days for treatment of your URI.  Perform sinus irrigation 2-3 times a day with a NeilMed sinus rinse kit and distilled water.  Do not use tap water.  You can use plain over-the-counter Mucinex every 6 hours to break up the stickiness of the mucus so your body can clear it.  Increase your oral fluid intake to thin out your mucus so that is also able for your body to clear more easily.  Take an over-the-counter probiotic, such as Culturelle-align-activia, 1 hour after each dose of antibiotic to prevent diarrhea.  Use the Atrovent nasal spray, 2 squirts in each nostril every 6 hours, as needed for runny nose and postnasal drip.  Use the Tessalon Perles every 8 hours during the day.  Take them with a small sip of water.  They may give you some numbness to the base of your tongue or a metallic taste in your mouth, this is normal.  Use the Promethazine DM cough syrup at bedtime for cough and congestion.  It will make you drowsy so do not take it during the day.  If you develop any new or worsening symptoms return for reevaluation or see your primary care provider.      ED Prescriptions     Medication Sig Dispense Auth. Provider   doxycycline (VIBRAMYCIN) 100 MG capsule Take 1 capsule (100 mg total) by mouth 2 (two) times daily. 20 capsule Margarette Canada, NP   ipratropium (ATROVENT) 0.06 % nasal spray Place 2 sprays into both nostrils 4 (four) times daily. 15 mL Margarette Canada, NP   benzonatate (TESSALON) 100 MG capsule Take 2 capsules (200 mg total) by mouth every 8 (eight) hours. 21 capsule Margarette Canada,  NP   promethazine-dextromethorphan (PROMETHAZINE-DM) 6.25-15 MG/5ML syrup Take 5 mLs by mouth 4 (four) times daily as needed. 118 mL Margarette Canada, NP      PDMP not reviewed this encounter.   Margarette Canada, NP 05/17/22 [phone]

## 2022-05-20 DIAGNOSIS — H0289 Other specified disorders of eyelid: Secondary | ICD-10-CM | POA: Diagnosis not present

## 2022-07-09 DIAGNOSIS — J329 Chronic sinusitis, unspecified: Secondary | ICD-10-CM | POA: Diagnosis not present

## 2022-07-09 DIAGNOSIS — H903 Sensorineural hearing loss, bilateral: Secondary | ICD-10-CM | POA: Diagnosis not present

## 2022-07-09 DIAGNOSIS — R0981 Nasal congestion: Secondary | ICD-10-CM | POA: Diagnosis not present

## 2022-07-09 DIAGNOSIS — J309 Allergic rhinitis, unspecified: Secondary | ICD-10-CM | POA: Diagnosis not present

## 2022-07-09 DIAGNOSIS — H6983 Other specified disorders of Eustachian tube, bilateral: Secondary | ICD-10-CM | POA: Diagnosis not present

## 2022-07-09 DIAGNOSIS — R221 Localized swelling, mass and lump, neck: Secondary | ICD-10-CM | POA: Diagnosis not present

## 2022-07-22 DIAGNOSIS — J301 Allergic rhinitis due to pollen: Secondary | ICD-10-CM | POA: Diagnosis not present

## 2022-07-28 DIAGNOSIS — J018 Other acute sinusitis: Secondary | ICD-10-CM | POA: Diagnosis not present

## 2022-08-07 ENCOUNTER — Ambulatory Visit: Payer: PPO | Admitting: Dermatology

## 2022-08-07 DIAGNOSIS — L820 Inflamed seborrheic keratosis: Secondary | ICD-10-CM

## 2022-08-07 DIAGNOSIS — L570 Actinic keratosis: Secondary | ICD-10-CM

## 2022-08-07 DIAGNOSIS — L821 Other seborrheic keratosis: Secondary | ICD-10-CM | POA: Diagnosis not present

## 2022-08-07 DIAGNOSIS — L814 Other melanin hyperpigmentation: Secondary | ICD-10-CM | POA: Diagnosis not present

## 2022-08-07 DIAGNOSIS — L578 Other skin changes due to chronic exposure to nonionizing radiation: Secondary | ICD-10-CM | POA: Diagnosis not present

## 2022-08-07 NOTE — Patient Instructions (Signed)
Cryotherapy Aftercare  Wash gently with soap and water everyday.   Apply Vaseline and Band-Aid daily until healed.     Due to recent changes in healthcare laws, you may see results of your pathology and/or laboratory studies on MyChart before the doctors have had a chance to review them. We understand that in some cases there may be results that are confusing or concerning to you. Please understand that not all results are received at the same time and often the doctors may need to interpret multiple results in order to provide you with the best plan of care or course of treatment. Therefore, we ask that you please give us 2 business days to thoroughly review all your results before contacting the office for clarification. Should we see a critical lab result, you will be contacted sooner.   If You Need Anything After Your Visit  If you have any questions or concerns for your doctor, please call our main line at [phone] and press option 4 to reach your doctor's medical assistant. If no one answers, please leave a voicemail as directed and we will return your call as soon as possible. Messages left after 4 pm will be answered the following business day.   You may also send us a message via MyChart. We typically respond to MyChart messages within 1-2 business days.  For prescription refills, please ask your pharmacy to contact our office. Our fax number is [phone].  If you have an urgent issue when the clinic is closed that cannot wait until the next business day, you can page your doctor at the number below.    Please note that while we do our best to be available for urgent issues outside of office hours, we are not available 24/7.   If you have an urgent issue and are unable to reach us, you may choose to seek medical care at your doctor's office, retail clinic, urgent care center, or emergency room.  If you have a medical emergency, please immediately call 911 or go to the  emergency department.  Pager Numbers  - Dr. Kowalski: [phone]  - Dr. Moye: [phone]  - Dr. Stewart: [phone]  In the event of inclement weather, please call our main line at [phone] for an update on the status of any delays or closures.  Dermatology Medication Tips: Please keep the boxes that topical medications come in in order to help keep track of the instructions about where and how to use these. Pharmacies typically print the medication instructions only on the boxes and not directly on the medication tubes.   If your medication is too expensive, please contact our office at [phone] option 4 or send us a message through MyChart.   We are unable to tell what your co-pay for medications will be in advance as this is different depending on your insurance coverage. However, we may be able to find a substitute medication at lower cost or fill out paperwork to get insurance to cover a needed medication.   If a prior authorization is required to get your medication covered by your insurance company, please allow us 1-2 business days to complete this process.  Drug prices often vary depending on where the prescription is filled and some pharmacies may offer cheaper prices.  The website www.goodrx.com contains coupons for medications through different pharmacies. The prices here do not account for what the cost may be with help from insurance (it may be cheaper with your insurance), but the website can   give you the price if you did not use any insurance.  - You can print the associated coupon and take it with your prescription to the pharmacy.  - You may also stop by our office during regular business hours and pick up a GoodRx coupon card.  - If you need your prescription sent electronically to a different pharmacy, notify our office through [facility] MyChart or by phone at [phone] option 4.     Si Usted Necesita Algo Despus de Su Visita  Tambin puede  enviarnos un mensaje a travs de MyChart. Por lo general respondemos a los mensajes de MyChart en el transcurso de 1 a 2 das hbiles.  Para renovar recetas, por favor pida a su farmacia que se ponga en contacto con nuestra oficina. Nuestro nmero de fax es el [phone].  Si tiene un asunto urgente cuando la clnica est cerrada y que no puede esperar hasta el siguiente da hbil, puede llamar/localizar a su doctor(a) al nmero que aparece a continuacin.   Por favor, tenga en cuenta que aunque hacemos todo lo posible para estar disponibles para asuntos urgentes fuera del horario de oficina, no estamos disponibles las 24 horas del da, los 7 das de la semana.   Si tiene un problema urgente y no puede comunicarse con nosotros, puede optar por buscar atencin mdica  en el consultorio de su doctor(a), en una clnica privada, en un centro de atencin urgente o en una sala de emergencias.  Si tiene una emergencia mdica, por favor llame inmediatamente al 911 o vaya a la sala de emergencias.  Nmeros de bper  - Dr. Kowalski: [phone]  - Dra. Moye: [phone]  - Dra. Stewart: [phone]  En caso de inclemencias del tiempo, por favor llame a nuestra lnea principal al [phone] para una actualizacin sobre el estado de cualquier retraso o cierre.  Consejos para la medicacin en dermatologa: Por favor, guarde las cajas en las que vienen los medicamentos de uso tpico para ayudarle a seguir las instrucciones sobre dnde y cmo usarlos. Las farmacias generalmente imprimen las instrucciones del medicamento slo en las cajas y no directamente en los tubos del medicamento.   Si su medicamento es muy caro, por favor, pngase en contacto con nuestra oficina llamando al [phone] y presione la opcin 4 o envenos un mensaje a travs de MyChart.   No podemos decirle cul ser su copago por los medicamentos por adelantado ya que esto es diferente dependiendo de la cobertura de su seguro.  Sin embargo, es posible que podamos encontrar un medicamento sustituto a menor costo o llenar un formulario para que el seguro cubra el medicamento que se considera necesario.   Si se requiere una autorizacin previa para que su compaa de seguros cubra su medicamento, por favor permtanos de 1 a 2 das hbiles para completar este proceso.  Los precios de los medicamentos varan con frecuencia dependiendo del lugar de dnde se surte la receta y alguna farmacias pueden ofrecer precios ms baratos.  El sitio web www.goodrx.com tiene cupones para medicamentos de diferentes farmacias. Los precios aqu no tienen en cuenta lo que podra costar con la ayuda del seguro (puede ser ms barato con su seguro), pero el sitio web puede darle el precio si no utiliz ningn seguro.  - Puede imprimir el cupn correspondiente y llevarlo con su receta a la farmacia.  - Tambin puede pasar por nuestra oficina durante el horario de atencin regular y recoger una tarjeta de cupones de GoodRx.  -   Si necesita que su receta se enve electrnicamente a una farmacia diferente, informe a nuestra oficina a travs de MyChart de [place] o por telfono llamando al [phone] y presione la opcin 4.  

## 2022-08-07 NOTE — Progress Notes (Signed)
   Follow-Up Visit   Subjective  Sara Rhodes is a 66 y.o. female who presents for the following: Other (Spot of arms ). The patient has spots, moles and lesions to be evaluated, some may be new or changing and the patient has concerns that these could be cancer.  The following portions of the chart were reviewed this encounter and updated as appropriate:   Tobacco  Allergies  Meds  Problems  Med Hx  Surg Hx  Fam Hx     Review of Systems:  No other skin or systemic complaints except as noted in HPI or Assessment and Plan.  Objective  Well appearing patient in no apparent distress; mood and affect are within normal limits.  A focused examination was performed including scalp, face, arms, chest, back. Relevant physical exam findings are noted in the Assessment and Plan.  Right Temple x 1, scalp x 2, right neck x 1, left lat elbow x 1 (5) Erythematous stuck-on, waxy papule or plaque  Left hand x 1, right hand x 1 (2) Erythematous thin papules/macules with gritty scale.    Assessment & Plan   Actinic Damage - chronic, secondary to cumulative UV radiation exposure/sun exposure over time - diffuse scaly erythematous macules with underlying dyspigmentation - Recommend daily broad spectrum sunscreen SPF 30+ to sun-exposed areas, reapply every 2 hours as needed.  - Recommend staying in the shade or wearing long sleeves, sun glasses (UVA+UVB protection) and wide brim hats (4-inch brim around the entire circumference of the hat). - Call for new or changing lesions.  Seborrheic Keratoses - Stuck-on, waxy, tan-brown papules and/or plaques  - Benign-appearing - Discussed benign etiology and prognosis. - Observe - Call for any changes  Lentigines - Scattered tan macules - Due to sun exposure - Benign-appearing, observe - Recommend daily broad spectrum sunscreen SPF 30+ to sun-exposed areas, reapply every 2 hours as needed. - Call for any changes  Inflamed seborrheic  keratosis (5) Right Temple x 1, scalp x 2, right neck x 1, left lat elbow x 1  Destruction of lesion - Right Temple x 1, scalp x 2, right neck x 1, left lat elbow x 1 Complexity: simple   Destruction method: cryotherapy   Informed consent: discussed and consent obtained   Timeout:  patient name, date of birth, surgical site, and procedure verified Lesion destroyed using liquid nitrogen: Yes   Region frozen until ice ball extended beyond lesion: Yes   Outcome: patient tolerated procedure well with no complications   Post-procedure details: wound care instructions given    AK (actinic keratosis) (2) Left hand x 1, right hand x 1  Destruction of lesion - Left hand x 1, right hand x 1 Complexity: simple   Destruction method: cryotherapy   Informed consent: discussed and consent obtained   Timeout:  patient name, date of birth, surgical site, and procedure verified Lesion destroyed using liquid nitrogen: Yes   Region frozen until ice ball extended beyond lesion: Yes   Outcome: patient tolerated procedure well with no complications   Post-procedure details: wound care instructions given     Return in about 1 year (around 08/08/2023).  I, Ashok Cordia, CMA, am acting as scribe for Sarina Ser, MD . Documentation: I have reviewed the above documentation for accuracy and completeness, and I agree with the above.  Sarina Ser, MD

## 2022-08-08 DIAGNOSIS — R7303 Prediabetes: Secondary | ICD-10-CM | POA: Diagnosis not present

## 2022-08-08 DIAGNOSIS — E7800 Pure hypercholesterolemia, unspecified: Secondary | ICD-10-CM | POA: Diagnosis not present

## 2022-08-11 DIAGNOSIS — J309 Allergic rhinitis, unspecified: Secondary | ICD-10-CM | POA: Diagnosis not present

## 2022-08-11 DIAGNOSIS — J329 Chronic sinusitis, unspecified: Secondary | ICD-10-CM | POA: Diagnosis not present

## 2022-08-12 ENCOUNTER — Encounter: Payer: Self-pay | Admitting: Dermatology

## 2022-08-12 DIAGNOSIS — R7303 Prediabetes: Secondary | ICD-10-CM | POA: Diagnosis not present

## 2022-08-12 DIAGNOSIS — E7800 Pure hypercholesterolemia, unspecified: Secondary | ICD-10-CM | POA: Diagnosis not present

## 2022-08-12 DIAGNOSIS — Z1331 Encounter for screening for depression: Secondary | ICD-10-CM | POA: Diagnosis not present

## 2022-08-12 DIAGNOSIS — Z0000 Encounter for general adult medical examination without abnormal findings: Secondary | ICD-10-CM | POA: Diagnosis not present

## 2022-08-12 DIAGNOSIS — I10 Essential (primary) hypertension: Secondary | ICD-10-CM | POA: Diagnosis not present

## 2022-08-12 DIAGNOSIS — R5382 Chronic fatigue, unspecified: Secondary | ICD-10-CM | POA: Diagnosis not present

## 2022-08-12 DIAGNOSIS — J301 Allergic rhinitis due to pollen: Secondary | ICD-10-CM | POA: Diagnosis not present

## 2022-08-18 DIAGNOSIS — J301 Allergic rhinitis due to pollen: Secondary | ICD-10-CM | POA: Diagnosis not present

## 2022-08-21 DIAGNOSIS — J301 Allergic rhinitis due to pollen: Secondary | ICD-10-CM | POA: Diagnosis not present

## 2022-08-25 DIAGNOSIS — J301 Allergic rhinitis due to pollen: Secondary | ICD-10-CM | POA: Diagnosis not present

## 2022-08-28 DIAGNOSIS — J301 Allergic rhinitis due to pollen: Secondary | ICD-10-CM | POA: Diagnosis not present

## 2022-09-01 ENCOUNTER — Encounter (INDEPENDENT_AMBULATORY_CARE_PROVIDER_SITE_OTHER): Payer: Self-pay

## 2022-09-01 DIAGNOSIS — J301 Allergic rhinitis due to pollen: Secondary | ICD-10-CM | POA: Diagnosis not present

## 2022-09-02 ENCOUNTER — Encounter: Payer: PPO | Admitting: Obstetrics and Gynecology

## 2022-09-02 DIAGNOSIS — Z01419 Encounter for gynecological examination (general) (routine) without abnormal findings: Secondary | ICD-10-CM

## 2022-09-02 DIAGNOSIS — Z1231 Encounter for screening mammogram for malignant neoplasm of breast: Secondary | ICD-10-CM

## 2022-09-03 DIAGNOSIS — J301 Allergic rhinitis due to pollen: Secondary | ICD-10-CM | POA: Diagnosis not present

## 2022-09-04 DIAGNOSIS — J301 Allergic rhinitis due to pollen: Secondary | ICD-10-CM | POA: Diagnosis not present

## 2022-09-08 DIAGNOSIS — J301 Allergic rhinitis due to pollen: Secondary | ICD-10-CM | POA: Diagnosis not present

## 2022-09-11 DIAGNOSIS — J301 Allergic rhinitis due to pollen: Secondary | ICD-10-CM | POA: Diagnosis not present

## 2022-09-15 DIAGNOSIS — J301 Allergic rhinitis due to pollen: Secondary | ICD-10-CM | POA: Diagnosis not present

## 2022-09-20 ENCOUNTER — Ambulatory Visit
Admission: EM | Admit: 2022-09-20 | Discharge: 2022-09-20 | Disposition: A | Discharge status: Home / Self Care | Payer: PPO | Attending: Physician Assistant | Admitting: Physician Assistant

## 2022-09-20 DIAGNOSIS — Z20822 Contact with and (suspected) exposure to covid-19: Secondary | ICD-10-CM | POA: Diagnosis not present

## 2022-09-20 DIAGNOSIS — J309 Allergic rhinitis, unspecified: Secondary | ICD-10-CM

## 2022-09-20 DIAGNOSIS — R21 Rash and other nonspecific skin eruption: Secondary | ICD-10-CM

## 2022-09-20 DIAGNOSIS — R051 Acute cough: Secondary | ICD-10-CM

## 2022-09-20 DIAGNOSIS — Z87891 Personal history of nicotine dependence: Secondary | ICD-10-CM | POA: Diagnosis not present

## 2022-09-20 DIAGNOSIS — R059 Cough, unspecified: Secondary | ICD-10-CM | POA: Diagnosis present

## 2022-09-20 DIAGNOSIS — J069 Acute upper respiratory infection, unspecified: Secondary | ICD-10-CM

## 2022-09-20 LAB — RESP PANEL BY RT-PCR (FLU A&B, COVID) ARPGX2
Influenza A by PCR: NEGATIVE
Influenza B by PCR: NEGATIVE
SARS Coronavirus 2 by RT PCR: NEGATIVE

## 2022-09-20 MED ORDER — PREDNISONE 20 MG PO TABS: 40.0000 mg | ORAL_TABLET | Freq: Every day | ORAL | 0 refills | Status: AC

## 2022-09-20 MED ORDER — TRIAMCINOLONE ACETONIDE 0.5 % EX OINT: 1.0000 | TOPICAL_OINTMENT | Freq: Two times a day (BID) | CUTANEOUS | 0 refills | Status: DC

## 2022-09-20 NOTE — ED Provider Notes (Signed)
[facility]    CSN: [id number] Arrival date & time: 09/20/22  0932      History   Chief Complaint Chief Complaint  Patient presents with   Cough    Cough and chest congestion. X2 days    HPI Sara Rhodes is a 66 y.o. female presenting for onset of cough, congestion with yellowish-green sputum for the past few days.  Reports the sputum is a very foul taste.  She also reports fatigue.  Reports nasal congestion.  Denies fever, body aches, sinus pain, chest pain, sore throat, breathing difficulty.  Reports she has a history of significant allergies and does follow-up with a specialist.  She says she was seen there in October for similar symptoms and was told she had the flu but was not tested for the flu.  She reports that sometimes she takes prednisone to help with her cough because nothing else helps.  She has tried OTC meds without any relief.  Additionally she reports a rash of the right arm after getting an allergy injection 5 days ago.  She says it is extremely itchy.  She has no sick contacts and she has no other complaints.  HPI  Past Medical History:  Diagnosis Date   Acid reflux    Behcet's disease ([place])    Family history of anesthesia complication    N/V daughter   Fatty liver    Hyperlipemia    Hypertension    Obesity    Schatzki's ring    Tobacco use    Vaginal atrophy    Vulvar itching     Patient Active Problem List   Diagnosis Date Noted   Chondromalacia of right knee 08/12/2020   Borderline diabetes mellitus 06/24/2018   Status post hysterectomy with oophorectomy 01/21/2017   Fatty liver 10/24/2015   GERD (gastroesophageal reflux disease) 03/10/2014   HTN (hypertension) 03/10/2014   Obesity 03/10/2014   Hypercholesterolemia 03/10/2014   Varicose veins of bilateral lower extremities with other complications 97/35/3299   HNP (herniated nucleus pulposus), thoracic 03/14/2013    Past Surgical History:  Procedure Laterality Date    ABDOMINAL HYSTERECTOMY     total- lsh bso- fibroids   BACK SURGERY     CESAREAN SECTION     COLONOSCOPY WITH PROPOFOL N/A 12/31/2015   Procedure: COLONOSCOPY WITH PROPOFOL;  Surgeon: Manya Silvas, MD;  Location: [place];  Service: Endoscopy;  Laterality: N/A;   ESOPHAGOGASTRODUODENOSCOPY (EGD) WITH PROPOFOL N/A 12/31/2015   Procedure: ESOPHAGOGASTRODUODENOSCOPY (EGD) WITH PROPOFOL;  Surgeon: Manya Silvas, MD;  Location: [facility]COPY;  Service: Endoscopy;  Laterality: N/A;   KNEE ARTHROPLASTY Right 12/05/2020   Procedure: COMPUTER ASSISTED TOTAL KNEE ARTHROPLASTY;  Surgeon: Dereck Leep, MD;  Location: [facility];  Service: Orthopedics;  Laterality: Right;   KNEE ARTHROSCOPY Right 06/25/2020   Procedure: ARTHROSCOPY KNEE;  Surgeon: Dereck Leep, MD;  Location: [facility];  Service: Orthopedics;  Laterality: Right;   THORACIC DISCECTOMY Right 03/14/2013   Procedure: THORACIC DISCECTOMY;  Surgeon: Charlie Pitter, MD;  Location: [place] [facility];  Service: Neurosurgery;  Laterality: Right;  Thoracic four-five and thoracic nine-ten laminotomy with transpedicular microdiskectomy    OB History     Gravida  3   Para  3   Term  3   Preterm  0   AB  0   Living  3      SAB  0   IAB  0   Ectopic  0   Multiple  0   Live Births  3        Obstetric Comments  1st Menstrual Cycle: 12 1st Pregnancy: 25          Home Medications    Prior to Admission medications   Medication Sig Start Date End Date Taking? Authorizing Provider  amLODipine-benazepril (LOTREL) 5-20 MG capsule Take 1 capsule by mouth daily. 11/25/21  Yes [provider]  benzonatate (TESSALON) 100 MG capsule Take 2 capsules (200 mg total) by mouth every 8 (eight) hours. 05/17/22  Yes Margarette Canada, NP  celecoxib (CELEBREX) 200 MG capsule Take 200 mg by mouth 2 (two) times daily.   Yes [provider]  cetirizine (ZYRTEC) 10 MG tablet Take by mouth. 08/11/22  Yes [provider]   Crisaborole (EUCRISA) 2 % OINT Apply 2 % topically daily as needed (irritation).   Yes [provider]  doxycycline (VIBRAMYCIN) 100 MG capsule Take 1 capsule (100 mg total) by mouth 2 (two) times daily. 05/17/22  Yes Margarette Canada, NP  hydrochlorothiazide (HYDRODIURIL) 25 MG tablet Take 25 mg by mouth daily.   Yes [provider]  ipratropium (ATROVENT) 0.06 % nasal spray Place 2 sprays into both nostrils 4 (four) times daily. 05/17/22  Yes Margarette Canada, NP  LAGEVRIO 200 MG CAPS capsule Take 4 capsules by mouth 2 (two) times daily. 11/27/21  Yes [provider]  omeprazole (PRILOSEC) 20 MG capsule Take 20 mg by mouth daily.   Yes [provider]  predniSONE (DELTASONE) 20 MG tablet Take 2 tablets (40 mg total) by mouth daily for 5 days. 09/20/22 09/25/22 Yes Danton Clap, PA-C  promethazine-dextromethorphan (PROMETHAZINE-DM) 6.25-15 MG/5ML syrup Take 5 mLs by mouth 4 (four) times daily as needed. 05/17/22  Yes Margarette Canada, NP  simvastatin (ZOCOR) 20 MG tablet Take 1 tablet by mouth at bedtime. 09/05/21  Yes [provider]  triamcinolone ointment (KENALOG) 0.5 % Apply 1 Application topically 2 (two) times daily. 09/20/22  Yes Danton Clap, PA-C  valACYclovir (VALTREX) 1000 MG tablet Take 1,000 mg by mouth See admin instructions. At first sign of outbreak, take 1 tablet (1,000 mg total) by mouth twice daily for 2-5 days (less if lesion resolves). 08/26/19  Yes [provider]  fluticasone (FLONASE) 50 MCG/ACT nasal spray Place into the nose.    [provider]    Family History Family History  Problem Relation Age of Onset   Crohn's disease Daughter    Leukemia Mother    Kidney failure Mother    AAA (abdominal aortic aneurysm) Father    Hypertension Father    Cancer Neg Hx    Diabetes Neg Hx    Heart disease Neg Hx     Social History Social History   Tobacco Use   Smoking status: Former    Packs/day: 0.50    Years:  10.00    Total pack years: 5.00    Types: Cigarettes    Quit date: 06/05/2017    Years since quitting: 5.2   Smokeless tobacco: Never  Vaping Use   Vaping Use: Never used  Substance Use Topics   Alcohol use: No   Drug use: No     Allergies   Ibuprofen, Tylenol [acetaminophen], Latex, Penicillin g, and Penicillins   Review of Systems Review of Systems  Constitutional:  Negative for chills, diaphoresis, fatigue and fever.  HENT:  Positive for congestion and rhinorrhea. Negative for ear pain, sinus pressure, sinus pain and sore throat.   Respiratory:  Positive for cough. Negative for shortness of breath and wheezing.   Gastrointestinal:  Negative for abdominal pain, nausea and vomiting.  Musculoskeletal:  Negative for arthralgias and myalgias.  Skin:  Positive for rash.  Neurological:  Negative for weakness and headaches.  Hematological:  Negative for adenopathy.     Physical Exam Triage Vital Signs ED Triage Vitals  Enc Vitals Group     BP      Pulse      Resp      Temp      Temp src      SpO2      Weight      Height      Head Circumference      Peak Flow      Pain Score      Pain Loc      Pain Edu?      Excl. in [place]?    No data found.  Updated Vital Signs BP 121/80 (BP Location: Right Arm)   Pulse 67   Temp 98.4 F (36.9 C) (Oral)   Resp 16   Ht '5\' 2"'$  (1.575 m)   Wt 220 lb (99.8 kg)   SpO2 95%   BMI 40.24 kg/m      Physical Exam Vitals and nursing note reviewed.  Constitutional:      General: She is not in acute distress.    Appearance: Normal appearance. She is not ill-appearing or toxic-appearing.  HENT:     Head: Normocephalic and atraumatic.     Nose: Congestion present.     Mouth/Throat:     Mouth: Mucous membranes are moist.     Pharynx: Oropharynx is clear.  Eyes:     General: No scleral icterus.       Right eye: No discharge.        Left eye: No discharge.     Conjunctiva/sclera: Conjunctivae normal.  Cardiovascular:     Rate and  Rhythm: Normal rate and regular rhythm.     Heart sounds: Normal heart sounds.  Pulmonary:     Effort: Pulmonary effort is normal. No respiratory distress.     Breath sounds: Normal breath sounds.  Musculoskeletal:     Cervical back: Neck supple.  Skin:    General: Skin is dry.     Findings: Rash (erythematous raised rash of lateral upper arm-right) present.  Neurological:     General: No focal deficit present.     Mental Status: She is alert. Mental status is at baseline.     Motor: No weakness.     Gait: Gait normal.  Psychiatric:        Mood and Affect: Mood normal.        Behavior: Behavior normal.        Thought Content: Thought content normal.      UC Treatments / Results  Labs (all labs ordered are listed, but only abnormal results are displayed) Labs Reviewed  RESP PANEL BY RT-PCR (FLU A&B, COVID) [id number]    EKG   Radiology No results found.  Procedures Procedures (including critical care time)  Medications Ordered in UC Medications - No data to display  Initial Impression / Assessment and Plan / UC Course  I have reviewed the triage vital signs and the nursing notes.  Pertinent labs & imaging results that were available during my care of the patient were reviewed by me and considered in my medical decision making (see chart for details).   66 year old female presents for  cough and congestion that began yesterday.  Also reports fatigue.  History of significant allergies.  Takes multiple allergy medications and uses Flonase.  Denies any associated fevers or breathing difficulty.  Patient believes her symptoms are related to allergy flareup.  She requests prednisone.  Vitals all normal and stable and she is overall well-appearing.  No respiratory distress.  On exam she does have nasal congestion.  Her chest is clear to auscultation heart regular rate and rhythm.  Additionally she has a small circular erythematous and slightly raised rash of the right lateral  upper arm.  Respiratory panel obtained.  Sent triamcinolone for the rash.  Appears to be mild reaction to the allergy injection.  Negative respiratory panel.  I communicated results to patient.  Sent prednisone to help with her allergies which some of this may be related to and advised her to continue her allergy medication, plenty of rest and fluids.  Reviewed return and ER precautions.  Final Clinical Impressions(s) / UC Diagnoses   Final diagnoses:  Viral upper respiratory tract infection  Acute cough  Rash and nonspecific skin eruption     Discharge Instructions      URI/COLD SYMPTOMS: Your exam today is consistent with a viral illness. Antibiotics are not indicated at this time. Use medications as directed, including cough syrup, nasal saline, and decongestants. Your symptoms should improve over the next few days and resolve within 7-10 days. Increase rest and fluids. F/u if symptoms worsen or predominate such as sore throat, ear pain, productive cough, shortness of breath, or if you develop high fevers or worsening fatigue over the next several days.    -If you have the flu I will send Tamiflu.  If you have COVID I will send antiviral medicine.  You will need to isolate 5 days and wear mask x5 days.  Today would be day 1. - If those tests are negative I can send prednisone as your symptoms could potentially be related to allergies.  I did send a topical steroid cream for the rash.     ED Prescriptions     Medication Sig Dispense Auth. Provider   triamcinolone ointment (KENALOG) 0.5 % Apply 1 Application topically 2 (two) times daily. 30 g Laurene Footman B, PA-C   predniSONE (DELTASONE) 20 MG tablet Take 2 tablets (40 mg total) by mouth daily for 5 days. 10 tablet Gretta Cool      PDMP not reviewed this encounter.   Danton Clap, PA-C 09/20/22 1429

## 2022-09-20 NOTE — ED Triage Notes (Signed)
Pt states that she has a cough and some chest congestion. X2 days

## 2022-09-20 NOTE — Discharge Instructions (Signed)
URI/COLD SYMPTOMS: Your exam today is consistent with a viral illness. Antibiotics are not indicated at this time. Use medications as directed, including cough syrup, nasal saline, and decongestants. Your symptoms should improve over the next few days and resolve within 7-10 days. Increase rest and fluids. F/u if symptoms worsen or predominate such as sore throat, ear pain, productive cough, shortness of breath, or if you develop high fevers or worsening fatigue over the next several days.    -If you have the flu I will send Tamiflu.  If you have COVID I will send antiviral medicine.  You will need to isolate 5 days and wear mask x5 days.  Today would be day 1. - If those tests are negative I can send prednisone as your symptoms could potentially be related to allergies.  I did send a topical steroid cream for the rash.

## 2022-09-22 DIAGNOSIS — J301 Allergic rhinitis due to pollen: Secondary | ICD-10-CM | POA: Diagnosis not present

## 2022-10-01 DIAGNOSIS — J018 Other acute sinusitis: Secondary | ICD-10-CM | POA: Diagnosis not present

## 2022-10-29 DIAGNOSIS — Z1231 Encounter for screening mammogram for malignant neoplasm of breast: Secondary | ICD-10-CM | POA: Diagnosis not present

## 2022-10-29 DIAGNOSIS — J302 Other seasonal allergic rhinitis: Secondary | ICD-10-CM | POA: Diagnosis not present

## 2022-10-29 DIAGNOSIS — J3489 Other specified disorders of nose and nasal sinuses: Secondary | ICD-10-CM | POA: Diagnosis not present

## 2022-10-29 LAB — HM MAMMOGRAPHY

## 2022-10-30 ENCOUNTER — Ambulatory Visit (INDEPENDENT_AMBULATORY_CARE_PROVIDER_SITE_OTHER): Payer: PPO | Admitting: Obstetrics and Gynecology

## 2022-10-30 ENCOUNTER — Encounter: Payer: Self-pay | Admitting: Obstetrics and Gynecology

## 2022-10-30 VITALS — BP 131/83 | HR 64 | Ht 62.0 in | Wt 234.6 lb

## 2022-10-30 DIAGNOSIS — Z01419 Encounter for gynecological examination (general) (routine) without abnormal findings: Secondary | ICD-10-CM

## 2022-10-30 DIAGNOSIS — Z1231 Encounter for screening mammogram for malignant neoplasm of breast: Secondary | ICD-10-CM

## 2022-10-30 NOTE — Progress Notes (Signed)
Patients presents for annual exam today. She states over the past 3-4 months having trouble fully emptying her bladder. Mammogram up to date.  History of hysterectomy. Annual labs are declined.  Patient states no other questions or concerns at this time.

## 2022-10-30 NOTE — Progress Notes (Signed)
HPI:      [name] is a 66 y.o. [phone] who LMP was No LMP recorded. Patient has had a hysterectomy.  Subjective:   She presents today for her annual examination.  She states that over the last 3 months she has had several issues with upper respiratory infections, problems with her allergy shots.  She has recently taken antibiotics and steroids and feels much better. She reports that she does have occasional difficulty emptying her bladder but by position herself on the toilet in a better manner she is able to accomplish this.  She reports that she has gotten used to it and does not want anything done about it. Patient has previously had a hysterectomy    Hx: The following portions of the patient's history were reviewed and updated as appropriate:             She  has a past medical history of Acid reflux, Behcet's disease ([place]), Family history of anesthesia complication, Fatty liver, Hyperlipemia, Hypertension, Obesity, Schatzki's ring, Tobacco use, Vaginal atrophy, and Vulvar itching. She does not have any pertinent problems on file. She  has a past surgical history that includes Thoracic discectomy (Right, 03/14/2013); Cesarean section; Back surgery; Colonoscopy with propofol (N/A, 12/31/2015); Esophagogastroduodenoscopy (egd) with propofol (N/A, 12/31/2015); Abdominal hysterectomy; Knee arthroscopy (Right, 06/25/2020); and Knee Arthroplasty (Right, 12/05/2020). Her family history includes AAA (abdominal aortic aneurysm) in her father; Crohn's disease in her daughter; Hypertension in her father; Kidney failure in her mother; Leukemia in her mother. She  reports that she quit smoking about 5 years ago. Her smoking use included cigarettes. She has a 5.00 pack-year smoking history. She has never used smokeless tobacco. She reports that she does not drink alcohol and does not use drugs. She has a current medication list which includes the following prescription(s): amlodipine-benazepril,  celecoxib, cetirizine, doxycycline, fluticasone, hydrochlorothiazide, omeprazole, simvastatin, triamcinolone ointment, valacyclovir, eucrisa, and ipratropium. She is allergic to ibuprofen, tylenol [acetaminophen], latex, penicillin g, and penicillins.       Review of Systems:  Review of Systems  Constitutional: Denied constitutional symptoms, night sweats, recent illness, fatigue, fever, insomnia and weight loss.  Eyes: Denied eye symptoms, eye pain, photophobia, vision change and visual disturbance.  Ears/Nose/Throat/Neck: Denied ear, nose, throat or neck symptoms, hearing loss, nasal discharge, sinus congestion and sore throat.  Cardiovascular: Denied cardiovascular symptoms, arrhythmia, chest pain/pressure, edema, exercise intolerance, orthopnea and palpitations.  Respiratory: Denied pulmonary symptoms, asthma, pleuritic pain, productive sputum, cough, dyspnea and wheezing.  Gastrointestinal: Denied, gastro-esophageal reflux, melena, nausea and vomiting.  Genitourinary: See HPI for additional information.  Musculoskeletal: Denied musculoskeletal symptoms, stiffness, swelling, muscle weakness and myalgia.  Dermatologic: Denied dermatology symptoms, rash and scar.  Neurologic: Denied neurology symptoms, dizziness, headache, neck pain and syncope.  Psychiatric: Denied psychiatric symptoms, anxiety and depression.  Endocrine: Denied endocrine symptoms including hot flashes and night sweats.   Meds:   Current Outpatient Medications on File Prior to Visit  Medication Sig Dispense Refill   amLODipine-benazepril (LOTREL) 5-20 MG capsule Take 1 capsule by mouth daily.     celecoxib (CELEBREX) 200 MG capsule Take 200 mg by mouth 2 (two) times daily.     cetirizine (ZYRTEC) 10 MG tablet Take by mouth.     doxycycline (VIBRAMYCIN) 100 MG capsule Take 1 capsule (100 mg total) by mouth 2 (two) times daily. 20 capsule 0   fluticasone (FLONASE) 50 MCG/ACT nasal spray Place into the nose.      hydrochlorothiazide (HYDRODIURIL) 25 MG  tablet Take 25 mg by mouth daily.     omeprazole (PRILOSEC) 20 MG capsule Take 20 mg by mouth daily.     simvastatin (ZOCOR) 20 MG tablet Take 1 tablet by mouth at bedtime.     triamcinolone ointment (KENALOG) 0.5 % Apply 1 Application topically 2 (two) times daily. 30 g 0   valACYclovir (VALTREX) 1000 MG tablet Take 1,000 mg by mouth See admin instructions. At first sign of outbreak, take 1 tablet (1,000 mg total) by mouth twice daily for 2-5 days (less if lesion resolves).     Crisaborole (EUCRISA) 2 % OINT Apply 2 % topically daily as needed (irritation). (Patient not taking: Reported on 10/30/2022)     ipratropium (ATROVENT) 0.06 % nasal spray Place 2 sprays into both nostrils 4 (four) times daily. (Patient not taking: Reported on 10/30/2022) 15 mL 12   No current facility-administered medications on file prior to visit.     Objective:     Vitals:   10/30/22 0813  BP: 131/83  Pulse: 64    Filed Weights   10/30/22 0813  Weight: 234 lb 9.6 oz (106.4 kg)              Physical examination General NAD, Conversant  HEENT Atraumatic; Op clear with mmm.  Normo-cephalic. Pupils reactive. Anicteric sclerae  Thyroid/Neck Smooth without nodularity or enlargement. Normal ROM.  Neck Supple.  Skin No rashes, lesions or ulceration. Normal palpated skin turgor. No nodularity.  Breasts: No masses or discharge.  Symmetric.  No axillary adenopathy.  Lungs: Clear to auscultation.No rales or wheezes. Normal Respiratory effort, no retractions.  Heart: NSR.  No murmurs or rubs appreciated. No periferal edema  Abdomen: Soft.  Non-tender.  No masses.  No HSM. No hernia  Extremities: Moves all appropriately.  Normal ROM for age. No lymphadenopathy.  Neuro: Oriented to PPT.  Normal mood. Normal affect.     Pelvic:   Vulva: Normal appearance.  No lesions.   Vagina: No lesions or abnormalities noted.  Support: Second-degree rectocele second-degree cystocele   Urethra No masses tenderness or scarring.  Meatus Normal size without lesions or prolapse.  Cervix: Surgically absent   Anus: Normal exam.  No lesions.  Perineum: Normal exam.  No lesions.        Bimanual   Uterus: Surgically absent   Adnexae: No masses.  Non-tender to palpation.  Cul-de-sac: Negative for abnormality.     Assessment:    [id number] Patient Active Problem List   Diagnosis Date Noted   Chondromalacia of right knee 08/12/2020   Borderline diabetes mellitus 06/24/2018   Status post hysterectomy with oophorectomy 01/21/2017   Fatty liver 10/24/2015   GERD (gastroesophageal reflux disease) 03/10/2014   HTN (hypertension) 03/10/2014   Obesity 03/10/2014   Hypercholesterolemia 03/10/2014   Varicose veins of bilateral lower extremities with other complications 44/01/4741   HNP (herniated nucleus pulposus), thoracic 03/14/2013     1. Well woman exam with routine gynecological exam     Some issues with pelvic relaxation but patient not having significant symptoms.   Plan:            1.  Basic Screening Recommendations The basic screening recommendations for asymptomatic women were discussed with the patient during her visit.  The age-appropriate recommendations were discussed with her and the rational for the tests reviewed.  When I am informed by the patient that another primary care physician has previously obtained the age-appropriate tests and they are up-to-date, only outstanding tests are ordered  and referrals given as necessary.  Abnormal results of tests will be discussed with her when all of her results are completed.  Routine preventative health maintenance measures emphasized: Exercise/Diet/Weight control, Tobacco Warnings, Alcohol/Substance use risks and Stress Management Mammogram was performed yesterday  Orders Orders Placed This Encounter  Procedures   [facility] MAMMOGRAPHY    No orders of the defined types were placed in this encounter.          F/U  Return in about 1 year (around 10/31/2023) for Annual Physical.  Finis Bud, M.D. 10/30/2022 8:32 AM

## 2023-01-08 DIAGNOSIS — M545 Low back pain, unspecified: Secondary | ICD-10-CM | POA: Diagnosis not present

## 2023-01-08 DIAGNOSIS — Z96651 Presence of right artificial knee joint: Secondary | ICD-10-CM | POA: Diagnosis not present

## 2023-01-13 ENCOUNTER — Telehealth: Payer: Self-pay | Admitting: Family

## 2023-01-13 ENCOUNTER — Encounter: Payer: Self-pay | Admitting: *Deleted

## 2023-01-13 ENCOUNTER — Other Ambulatory Visit: Payer: Self-pay

## 2023-01-13 ENCOUNTER — Ambulatory Visit
Admission: EM | Admit: 2023-01-13 | Discharge: 2023-01-13 | Disposition: A | Discharge status: Home / Self Care | Payer: PPO | Attending: Family | Admitting: Family

## 2023-01-13 DIAGNOSIS — R519 Headache, unspecified: Secondary | ICD-10-CM | POA: Insufficient documentation

## 2023-01-13 DIAGNOSIS — Z1152 Encounter for screening for COVID-19: Secondary | ICD-10-CM | POA: Diagnosis not present

## 2023-01-13 DIAGNOSIS — B349 Viral infection, unspecified: Secondary | ICD-10-CM | POA: Diagnosis not present

## 2023-01-13 DIAGNOSIS — R509 Fever, unspecified: Secondary | ICD-10-CM | POA: Insufficient documentation

## 2023-01-13 DIAGNOSIS — R0602 Shortness of breath: Secondary | ICD-10-CM | POA: Diagnosis not present

## 2023-01-13 LAB — RESP PANEL BY RT-PCR (RSV, FLU A&B, COVID)  RVPGX2
Influenza A by PCR: NEGATIVE
Influenza B by PCR: NEGATIVE
Resp Syncytial Virus by PCR: NEGATIVE
SARS Coronavirus 2 by RT PCR: NEGATIVE

## 2023-01-13 NOTE — Discharge Instructions (Signed)
Will notify you later today regarding test results (COVID and Influenza) and will prescribe additional medication if needed. Recommend go home and rest. May take OTC Delsym 2 teaspoons every 12 hours as needed for cough. Continue to push fluids to help loosen up mucus in sinuses. Follow-up pending lab results.

## 2023-01-13 NOTE — ED Triage Notes (Signed)
Pt reports starting last night tru this morning Pt has had sinus congestion,fever,sore throat and HA.  Pt also needs a work note.

## 2023-01-13 NOTE — ED Provider Notes (Signed)
[facility]    CSN: [id number] Arrival date & time: 01/13/23  0846      History   Chief Complaint Chief Complaint  Patient presents with   Sore Throat   Fever   congested    HPI Sara Rhodes is a 67 y.o. female.   67 year old female presents with sore throat, frontal headache, fatigue, sinus congestion and cough that started last night. Having some nausea this morning but no vomiting. Also feels warm/feverish. Has not taken any medication yet for symptoms. Took home COVID test which was negative. No other family members ill. No history of asthma. Has difficulty taking Tylenol or Ibuprofen due to GI upset. Other chronic health issues include HTN which is managed by Amlodipine-Benazepril and HCTZ daily. Takes Zocor for hyperlipidemia. Former smoker. Other medication include Celebrex, aspirin and Prilosec daily.   The history is provided by the patient.    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   Borderline diabetes mellitus 06/24/2018   Status post hysterectomy with oophorectomy 01/21/2017   Fatty liver 10/24/2015   GERD (gastroesophageal reflux disease) 03/10/2014   HTN (hypertension) 03/10/2014   Obesity 03/10/2014   Hypercholesterolemia 03/10/2014   Varicose veins of bilateral lower extremities with other complications 0000000   HNP (herniated nucleus pulposus), thoracic 03/14/2013    Past Surgical History:  Procedure Laterality Date   ABDOMINAL HYSTERECTOMY     total- lsh bso- fibroids   BACK SURGERY     CESAREAN SECTION     COLONOSCOPY WITH PROPOFOL N/A 12/31/2015   Procedure: COLONOSCOPY WITH PROPOFOL;  Surgeon:  Manya Silvas, MD;  Location: [facility];  Service: Endoscopy;  Laterality: N/A;   ESOPHAGOGASTRODUODENOSCOPY (EGD) WITH PROPOFOL N/A 12/31/2015   Procedure: ESOPHAGOGASTRODUODENOSCOPY (EGD) WITH PROPOFOL;  Surgeon: Manya Silvas, MD;  Location: [facility] END[facility]COPY;  Service: Endoscopy;  Laterality: N/A;   KNEE ARTHROPLASTY Right 12/05/2020   Procedure: COMPUTER ASSISTED TOTAL KNEE ARTHROPLASTY;  Surgeon: Dereck Leep, MD;  Location: [facility] ORS;  Service: Orthopedics;  Laterality: Right;   KNEE ARTHROSCOPY Right 06/25/2020   Procedure: ARTHROSCOPY KNEE;  Surgeon: Dereck Leep, MD;  Location: [facility] ORS;  Service: Orthopedics;  Laterality: Right;   THORACIC DISCECTOMY Right 03/14/2013   Procedure: THORACIC DISCECTOMY;  Surgeon: Charlie Pitter, MD;  Location: [place] NEURO ORS;  Service: Neurosurgery;  Laterality: Right;  Thoracic four-five and thoracic nine-ten laminotomy with transpedicular microdiskectomy    OB History     Gravida  3   Para  3   Term  3   Preterm  0   AB  0   Living  3      SAB  0   IAB  0   Ectopic  0   Multiple  0   Live Births  3        Obstetric Comments  1st Menstrual Cycle: 12 1st Pregnancy: 25          Home Medications    Prior to Admission medications   Medication Sig Start Date  End Date Taking? Authorizing Provider  amLODipine-benazepril (LOTREL) 5-20 MG capsule Take 1 capsule by mouth daily. 11/25/21   [provider]  celecoxib (CELEBREX) 200 MG capsule Take 200 mg by mouth 2 (two) times daily.    [provider]  Crisaborole (EUCRISA) 2 % OINT Apply 2 % topically daily as needed (irritation). Patient not taking: Reported on 10/30/2022    [provider]  hydrochlorothiazide (HYDRODIURIL) 25 MG tablet Take 25 mg by mouth daily.    [provider]  omeprazole (PRILOSEC) 20 MG capsule Take 20 mg by mouth daily.    [provider]  simvastatin (ZOCOR) 20 MG tablet Take 1 tablet by mouth at  bedtime. 09/05/21   [provider]  valACYclovir (VALTREX) 1000 MG tablet Take 1,000 mg by mouth See admin instructions. At first sign of outbreak, take 1 tablet (1,000 mg total) by mouth twice daily for 2-5 days (less if lesion resolves). 08/26/19   [provider]    Family History Family History  Problem Relation Age of Onset   Crohn's disease Daughter    Leukemia Mother    Kidney failure Mother    AAA (abdominal aortic aneurysm) Father    Hypertension Father    Cancer Neg Hx    Diabetes Neg Hx    Heart disease Neg Hx     Social History Social History   Tobacco Use   Smoking status: Former    Packs/day: 0.50    Years: 10.00    Total pack years: 5.00    Types: Cigarettes    Quit date: 06/05/2017    Years since quitting: 5.6   Smokeless tobacco: Never  Vaping Use   Vaping Use: Never used  Substance Use Topics   Alcohol use: No   Drug use: No     Allergies   Ibuprofen, Tylenol [acetaminophen], Latex, Penicillin g, and Penicillins   Review of Systems Review of Systems  Constitutional:  Positive for activity change, appetite change, fatigue and fever. Negative for diaphoresis.  HENT:  Positive for congestion, postnasal drip, sinus pressure, sinus pain and sore throat. Negative for ear discharge, ear pain, mouth sores and trouble swallowing.   Eyes:  Negative for pain, discharge, redness and itching.  Respiratory:  Positive for cough. Negative for chest tightness, shortness of breath and wheezing.   Gastrointestinal:  Positive for nausea. Negative for vomiting.  Musculoskeletal:  Positive for arthralgias and myalgias. Negative for neck pain and neck stiffness.  Skin:  Negative for color change and rash.  Allergic/Immunologic: Positive for environmental allergies. Negative for food allergies.  Neurological:  Positive for headaches. Negative for dizziness, tremors, seizures, syncope, speech difficulty and numbness.  Hematological:  Negative for  adenopathy. Does not bruise/bleed easily.     Physical Exam Triage Vital Signs ED Triage Vitals  Enc Vitals Group     BP 01/13/23 1008 (!) 147/69     Pulse Rate 01/13/23 1008 75     Resp 01/13/23 1008 20     Temp 01/13/23 1008 99 F (37.2 C)     Temp src --      SpO2 01/13/23 1008 98 %     Weight --      Height --      Head Circumference --      Peak Flow --      Pain Score 01/13/23 1006 4     Pain Loc --      Pain Edu? --      Excl.  in [place]? --    No data found.  Updated Vital Signs BP (!) 147/69   Pulse 75   Temp 99 F (37.2 C)   Resp 20   SpO2 98%   Visual Acuity Right Eye Distance:   Left Eye Distance:   Bilateral Distance:    Right Eye Near:   Left Eye Near:    Bilateral Near:     Physical Exam Vitals and nursing note reviewed.  Constitutional:      General: She is awake. She is not in acute distress.    Appearance: She is well-developed and overweight. She is ill-appearing.     Comments: She is sitting on the exam table in no acute distress but appears ill and tired.   HENT:     Head: Normocephalic and atraumatic.     Right Ear: Hearing, tympanic membrane, ear canal and external ear normal. Tympanic membrane is not injected, erythematous or bulging.     Left Ear: Hearing, tympanic membrane, ear canal and external ear normal. Tympanic membrane is not injected, erythematous or bulging.     Nose: Congestion present.     Right Sinus: Frontal sinus tenderness present. No maxillary sinus tenderness.     Left Sinus: Frontal sinus tenderness present. No maxillary sinus tenderness.     Mouth/Throat:     Lips: Pink.     Mouth: Mucous membranes are moist.     Pharynx: Uvula midline. Posterior oropharyngeal erythema present. No pharyngeal swelling, oropharyngeal exudate or uvula swelling.  Eyes:     Extraocular Movements: Extraocular movements intact.     Conjunctiva/sclera: Conjunctivae normal.  Cardiovascular:     Rate and Rhythm: Normal rate and regular  rhythm.     Heart sounds: Normal heart sounds. No murmur heard. Pulmonary:     Effort: Pulmonary effort is normal. No tachypnea, accessory muscle usage, respiratory distress or retractions.     Breath sounds: Normal breath sounds and air entry. No decreased air movement. No decreased breath sounds, wheezing, rhonchi or rales.  Musculoskeletal:     Cervical back: Normal range of motion and neck supple.  Lymphadenopathy:     Cervical: No cervical adenopathy.  Skin:    General: Skin is warm and dry.     Capillary Refill: Capillary refill takes less than 2 seconds.     Findings: No rash.  Neurological:     General: No focal deficit present.     Mental Status: She is alert and oriented to person, place, and time.  Psychiatric:        Attention and Perception: Attention normal.        Speech: Speech normal.        Behavior: Behavior is slowed. Behavior is cooperative.      UC Treatments / Results  Labs (all labs ordered are listed, but only abnormal results are displayed) Labs Reviewed  RESP PANEL BY RT-PCR (RSV, FLU A&B, COVID)  [id number]    EKG   Radiology No results found.  Procedures Procedures (including critical care time)  Medications Ordered in UC Medications - No data to display  Initial Impression / Assessment and Plan / UC Course  I have reviewed the triage vital signs and the nursing notes.  Pertinent labs & imaging results that were available during my care of the patient were reviewed by me and considered in my medical decision making (see chart for details).     Patient does not feel well and had been waiting for over 2 hours  before being seen by Provider so requests to have lab testing done and to go home and be notified of results. Reviewed with patient that she probably has a viral illness. Will test for COVID, Influenza and RSV. She does not have a bacterial infection at this time and does not need antibiotics. Rest. Continue to push fluids to help loosen  up mucus in sinuses. May take OTC Coricidin cold medication to help with symptoms or may take OTC Delsym 2 teaspoons every 12 hours as needed for cough. Will call patient later today with lab results. Note written for work- will modify if needed pending lab results.   Final Clinical Impressions(s) / UC Diagnoses   Final diagnoses:  Viral illness  Frontal headache     Discharge Instructions      Will notify you later today regarding test results (COVID and Influenza) and will prescribe additional medication if needed. Recommend go home and rest. May take OTC Delsym 2 teaspoons every 12 hours as needed for cough. Continue to push fluids to help loosen up mucus in sinuses. Follow-up pending lab results.     ED Prescriptions   None    PDMP not reviewed this encounter.   Katy Apo, NP 01/13/23 [phone]

## 2023-01-13 NOTE — Telephone Encounter (Signed)
Notified patient of negative COVID test result, negative Influenza test result and negative RSV test result. Discussed that she probably has a viral respiratory illness. She does not need an antibiotic at this time. Recommend she take OTC Coricidin decongestant/cold medication as directed to help with symptoms. Rest. Continue to push fluids to help loosen up mucus in sinuses and chest. Follow-up with her PCP or with Urgent Care in 3 to 4 days if not improving.

## 2023-01-15 ENCOUNTER — Ambulatory Visit
Admission: EM | Admit: 2023-01-15 | Discharge: 2023-01-15 | Disposition: A | Discharge status: Home / Self Care | Payer: PPO | Attending: Family Medicine | Admitting: Family Medicine

## 2023-01-15 ENCOUNTER — Ambulatory Visit (INDEPENDENT_AMBULATORY_CARE_PROVIDER_SITE_OTHER): Payer: PPO

## 2023-01-15 DIAGNOSIS — R059 Cough, unspecified: Secondary | ICD-10-CM | POA: Diagnosis not present

## 2023-01-15 DIAGNOSIS — Z87891 Personal history of nicotine dependence: Secondary | ICD-10-CM

## 2023-01-15 DIAGNOSIS — R509 Fever, unspecified: Secondary | ICD-10-CM | POA: Diagnosis not present

## 2023-01-15 DIAGNOSIS — J40 Bronchitis, not specified as acute or chronic: Secondary | ICD-10-CM

## 2023-01-15 DIAGNOSIS — R0789 Other chest pain: Secondary | ICD-10-CM | POA: Diagnosis not present

## 2023-01-15 MED ORDER — PREDNISONE 20 MG PO TABS: 40.0000 mg | ORAL_TABLET | Freq: Every day | ORAL | 0 refills | Status: AC

## 2023-01-15 MED ORDER — BENZONATATE 100 MG PO CAPS: 100.0000 mg | ORAL_CAPSULE | Freq: Three times a day (TID) | ORAL | 0 refills | Status: AC

## 2023-01-15 MED ORDER — ALBUTEROL SULFATE HFA 108 (90 BASE) MCG/ACT IN AERS: 2.0000 | INHALATION_SPRAY | RESPIRATORY_TRACT | 0 refills | Status: AC | PRN

## 2023-01-15 MED ORDER — PROMETHAZINE-DM 6.25-15 MG/5ML PO SYRP: 5.0000 mL | ORAL_SOLUTION | Freq: Four times a day (QID) | ORAL | 0 refills | Status: AC | PRN

## 2023-01-15 MED ORDER — DOXYCYCLINE HYCLATE 100 MG PO CAPS: 100.0000 mg | ORAL_CAPSULE | Freq: Two times a day (BID) | ORAL | 0 refills | Status: AC

## 2023-01-15 MED ORDER — IPRATROPIUM BROMIDE 0.06 % NA SOLN: 2.0000 | Freq: Four times a day (QID) | NASAL | 12 refills | Status: AC

## 2023-01-15 NOTE — Discharge Instructions (Addendum)
Your chest x-ray did not show pneumonia.  Believe your symptoms are related to a viral respiratory illness. Given your history of smoking. You could have underlying COPD. I sent steroids and antibiotics to your pharmacy.  I also sent a cough syrup with Tessalon Perles and a nasal spray to help with your symptoms.  Use your albuterol inhaler anytime that you feel short of breath and at that time.  Return to the urgent care if your symptoms acutely worsen or your cough last more than 3 weeks.

## 2023-01-15 NOTE — ED Provider Notes (Addendum)
[facility]    CSN: [id number] Arrival date & time: 01/15/23  1215      History   Chief Complaint Chief Complaint  Patient presents with   Cough   Nasal Congestion   Fever    HPI Sara Rhodes is a 67 y.o. female.   HPI   Sara Rhodes presents for cough, congestion and rhinorrhea. She was seen in urgent care 2 days ago and told it was viral.  Now it feels hot inside. Temperature max 100.3 F  this morning. Took Delsym and coricidin BP medicine. Stopped taking Coricidin meds as it made her feel nauseous. No nausea after stopping that medication.  Feels chest tightness and increasing fatigue. Requests work note.    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   Borderline diabetes mellitus 06/24/2018   Status post hysterectomy with oophorectomy 01/21/2017   Fatty liver 10/24/2015   GERD (gastroesophageal reflux disease) 03/10/2014   HTN (hypertension) 03/10/2014   Obesity 03/10/2014   Hypercholesterolemia 03/10/2014   Varicose veins of bilateral lower extremities with other complications 0000000   HNP (herniated nucleus pulposus), thoracic 03/14/2013    Past Surgical History:  Procedure Laterality Date   ABDOMINAL HYSTERECTOMY     total- lsh bso- fibroids   BACK SURGERY     CESAREAN SECTION     COLONOSCOPY WITH PROPOFOL N/A 12/31/2015   Procedure:  COLONOSCOPY WITH PROPOFOL;  Surgeon: Manya Silvas, MD;  [facility] END[facility]COPY;  Service: Endoscopy;  Laterality: N/A;   ESOPHAGOGASTRODUODENOSCOPY (EGD) WITH PROPOFOL N/A 12/31/2015   Procedure: ESOPHAGOGASTRODUODENOSCOPY (EGD) WITH PROPOFOL;  Surgeon: Manya Silvas, MD;  Location: [facility] ENDOSCOPY;  Service: Endoscopy;  Laterality: N/A;   KNEE ARTHROPLASTY Right 12/05/2020   Procedure: COMPUTER ASSISTED TOTAL KNEE ARTHROPLASTY;  Surgeon: Dereck Leep, MD;  Location: [facility] OR[facility];  Service: Orthopedics;  Laterality: Right;   KNEE ARTHROSCOPY Right 06/25/2020   Procedure: ARTHROSCOPY KNEE;  Surgeon: Dereck Leep, MD;  Location: [facility] ORS;  Service: Orthopedics;  Laterality: Right;   THORACIC DISCECTOMY Right 03/14/2013   Procedure: THORACIC DISCECTOMY;  Surgeon: Charlie Pitter, MD;  Location: [place] NEURO ORS;  Service: Neurosurgery;  Laterality: Right;  Thoracic four-five and thoracic nine-ten laminotomy with transpedicular microdiskectomy    OB History     Gravida  3   Para  3   Term  3   Preterm  0   AB  0   Living  3      SAB  0   IAB  0   Ectopic  0   Multiple  0   Live Births  3        Obstetric Comments  1st Menstrual Cycle: 12 1st Pregnancy: 25  Home Medications    Prior to Admission medications   Medication Sig Start Date End Date Taking? Authorizing Provider  albuterol (VENTOLIN HFA) 108 (90 Base) MCG/ACT inhaler Inhale 2 puffs into the lungs every 4 (four) hours as needed. 01/15/23  Yes Wilson Sample, DO  amLODipine-benazepril (LOTREL) 5-20 MG capsule Take 1 capsule by mouth daily. 11/25/21  Yes [provider]  benzonatate (TESSALON) 100 MG capsule Take 1 capsule (100 mg total) by mouth every 8 (eight) hours. 01/15/23  Yes Verneda Hollopeter, Ronnette Juniper, DO  celecoxib (CELEBREX) 200 MG capsule Take 200 mg by mouth 2 (two) times daily.   Yes [provider]  Crisaborole (EUCRISA) 2 % OINT Apply 2 % topically daily as needed (irritation).   Yes  [provider]  doxycycline (VIBRAMYCIN) 100 MG capsule Take 1 capsule (100 mg total) by mouth 2 (two) times daily. 01/15/23  Yes Donesha Wallander, DO  hydrochlorothiazide (HYDRODIURIL) 25 MG tablet Take 25 mg by mouth daily.   Yes [provider]  ipratropium (ATROVENT) 0.06 % nasal spray Place 2 sprays into both nostrils 4 (four) times daily. 01/15/23  Yes Glenice Ciccone, DO  omeprazole (PRILOSEC) 20 MG capsule Take 20 mg by mouth daily.   Yes [provider]  predniSONE (DELTASONE) 20 MG tablet Take 2 tablets (40 mg total) by mouth daily for 5 days. 01/15/23 01/20/23 Yes Libni Fusaro, DO  promethazine-dextromethorphan (PROMETHAZINE-DM) 6.25-15 MG/5ML syrup Take 5 mLs by mouth 4 (four) times daily as needed. 01/15/23  Yes Ladeana Laplant, DO  simvastatin (ZOCOR) 20 MG tablet Take 1 tablet by mouth at bedtime. 09/05/21  Yes [provider]  valACYclovir (VALTREX) 1000 MG tablet Take 1,000 mg by mouth See admin instructions. At first sign of outbreak, take 1 tablet (1,000 mg total) by mouth twice daily for 2-5 days (less if lesion resolves). 08/26/19  Yes [provider]    Family History Family History  Problem Relation Age of Onset   Crohn's disease Daughter    Leukemia Mother    Kidney failure Mother    AAA (abdominal aortic aneurysm) Father    Hypertension Father    Cancer Neg Hx    Diabetes Neg Hx    Heart disease Neg Hx     Social History Social History   Tobacco Use   Smoking status: Former    Packs/day: 0.50    Years: 10.00    Additional pack years: 0.00    Total pack years: 5.00    Types: Cigarettes    Quit date: 06/05/2017    Years since quitting: 5.6   Smokeless tobacco: Never  Vaping Use   Vaping Use: Never used  Substance Use Topics   Alcohol use: No   Drug use: No     Allergies   Ibuprofen, Tylenol [acetaminophen], Latex, Penicillin g, and Penicillins   Review of Systems Review of Systems: negative unless  otherwise stated in HPI.      Physical Exam Triage Vital Signs ED Triage Vitals  Enc Vitals Group     BP 01/15/23 1236 119/79     Pulse Rate 01/15/23 1236 81     Resp 01/15/23 1236 16     Temp 01/15/23 1236 99.4 F (37.4 C)     Temp Source 01/15/23 1236 Oral     SpO2 01/15/23 1236 94 %     Weight 01/15/23 1235 220 lb (99.8 kg)     Height 01/15/23 1235 '5\' 4"'$  (1.626 m)     Head Circumference --  Peak Flow --      Pain Score 01/15/23 1235 0     Pain Loc --      Pain Edu? --      Excl. in [place]? --    No data found.  Updated Vital Signs BP 119/79 (BP Location: Left Arm)   Pulse 81   Temp 99.4 F (37.4 C) (Oral)   Resp 16   Ht '5\' 4"'$  (1.626 m)   Wt 99.8 kg   SpO2 94%   BMI 37.76 kg/m   Visual Acuity Right Eye Distance:   Left Eye Distance:   Bilateral Distance:    Right Eye Near:   Left Eye Near:    Bilateral Near:     Physical Exam GEN:     alert, ill but non-toxic appearing female in no distress    HENT:  mucus membranes moist,  moderate erythematous edematous turbinates, clear nasal discharge EYES:   no scleral injection or discharge NECK:  normal ROM RESP:  no increased work of breathing, rhonchi that clear with cough  CVS:   regular rate and rhythm Skin:   warm and dry, well perfused     UC Treatments / Results  Labs (all labs ordered are listed, but only abnormal results are displayed) Labs Reviewed - No data to display  EKG   Radiology DG Chest 2 View  Result Date: 01/15/2023 CLINICAL DATA:  Cough, chest tightness, fever, and nasal congestion. EXAM: CHEST - 2 VIEW COMPARISON:  Chest radiographs 03/11/2013 FINDINGS: The cardiomediastinal silhouette is unchanged with normal heart size. No airspace consolidation, edema, pleural effusion, or pneumothorax is identified. No acute osseous abnormality is seen. IMPRESSION: No active cardiopulmonary disease. Electronically Signed   By: Logan Bores M.D.   On: 01/15/2023 13:10    Procedures Procedures  (including critical care time)  Medications Ordered in UC Medications - No data to display  Initial Impression / Assessment and Plan / UC Course  I have reviewed the triage vital signs and the nursing notes.  Pertinent labs & imaging results that were available during my care of the patient were reviewed by me and considered in my medical decision making (see chart for details).       Pt is a 67 y.o. female who presents for  respiratory symptoms. Uri has elevated temperature here 99.4 F.   Satting well on room air. Overall pt is ill but non-toxic appearing, well hydrated, without respiratory distress. Pulmonary exam is remarkabl for rhonchi that clear with cough.  COVID, RSV and influenza testing were negative 2 days ago. Discussed obtaining a chest xray and patient agreeable.   Chest xray personally reviewed by me without focal pneumonia, pleural effusion, cardiomegaly or pneumothorax.  History consistent with viral respiratory illness. Though I question given her history of smoking if she is not improving due to having underlying COPD. Given steroids and antibiotics for this reason. Discussed symptomatic treatment.  Typical duration of symptoms discussed.  Given albuterol inhaler, Promethazine DM and Tessalon Perles for cough.  Atrovent nasal spray for nasal congestion.  Return and ED precautions given and voiced understanding. Discussed MDM, treatment plan and plan for follow-up with patient who agrees with plan.     Final Clinical Impressions(s) / UC Diagnoses   Final diagnoses:  Former tobacco use  Bronchitis     Discharge Instructions      Your chest x-ray did not show pneumonia.  Believe your symptoms are related to a viral respiratory illness. Given your history  of smoking. You could have underlying COPD. I sent steroids and antibiotics to your pharmacy.  I also sent a cough syrup with Tessalon Perles and a nasal spray to help with your symptoms.  Use your albuterol  inhaler anytime that you feel short of breath and at that time.  Return to the urgent care if your symptoms acutely worsen or your cough last more than 3 weeks.      ED Prescriptions     Medication Sig Dispense Auth. Provider   promethazine-dextromethorphan (PROMETHAZINE-DM) 6.25-15 MG/5ML syrup Take 5 mLs by mouth 4 (four) times daily as needed. 118 mL Lateia Fraser, DO   benzonatate (TESSALON) 100 MG capsule Take 1 capsule (100 mg total) by mouth every 8 (eight) hours. 21 capsule Danielly Ackerley, DO   albuterol (VENTOLIN HFA) 108 (90 Base) MCG/ACT inhaler Inhale 2 puffs into the lungs every 4 (four) hours as needed. 6.7 g Marguerita Stapp, DO   ipratropium (ATROVENT) 0.06 % nasal spray Place 2 sprays into both nostrils 4 (four) times daily. 15 mL Elanah Osmanovic, DO   doxycycline (VIBRAMYCIN) 100 MG capsule Take 1 capsule (100 mg total) by mouth 2 (two) times daily. 20 capsule Adaora Mchaney, DO   predniSONE (DELTASONE) 20 MG tablet Take 2 tablets (40 mg total) by mouth daily for 5 days. 10 tablet Lyndee Hensen, DO      I have reviewed the PDMP during this encounter.     Lyndee Hensen, DO 01/15/23 1336

## 2023-01-15 NOTE — ED Triage Notes (Signed)
Pt c/o cough,fever & nasal congestion x2 days. Was seen on 3/12 for same issues & was dx w/viral illness. Otc coricidin & delsym w/o relief.

## 2023-01-19 DIAGNOSIS — B349 Viral infection, unspecified: Secondary | ICD-10-CM | POA: Diagnosis not present

## 2023-02-11 DIAGNOSIS — I10 Essential (primary) hypertension: Secondary | ICD-10-CM | POA: Diagnosis not present

## 2023-02-11 DIAGNOSIS — R7303 Prediabetes: Secondary | ICD-10-CM | POA: Diagnosis not present

## 2023-02-11 DIAGNOSIS — E7800 Pure hypercholesterolemia, unspecified: Secondary | ICD-10-CM | POA: Diagnosis not present

## 2023-02-18 DIAGNOSIS — E7800 Pure hypercholesterolemia, unspecified: Secondary | ICD-10-CM | POA: Diagnosis not present

## 2023-02-18 DIAGNOSIS — I10 Essential (primary) hypertension: Secondary | ICD-10-CM | POA: Diagnosis not present

## 2023-02-18 DIAGNOSIS — E785 Hyperlipidemia, unspecified: Secondary | ICD-10-CM | POA: Diagnosis not present

## 2023-02-18 DIAGNOSIS — Z0000 Encounter for general adult medical examination without abnormal findings: Secondary | ICD-10-CM | POA: Diagnosis not present

## 2023-02-18 DIAGNOSIS — R058 Other specified cough: Secondary | ICD-10-CM | POA: Diagnosis not present

## 2023-02-18 DIAGNOSIS — E1169 Type 2 diabetes mellitus with other specified complication: Secondary | ICD-10-CM | POA: Diagnosis not present

## 2023-04-13 DIAGNOSIS — R350 Frequency of micturition: Secondary | ICD-10-CM | POA: Diagnosis not present

## 2023-04-13 DIAGNOSIS — R300 Dysuria: Secondary | ICD-10-CM | POA: Diagnosis not present

## 2023-04-13 DIAGNOSIS — R339 Retention of urine, unspecified: Secondary | ICD-10-CM | POA: Diagnosis not present

## 2023-04-14 ENCOUNTER — Ambulatory Visit: Payer: PPO

## 2023-05-14 DIAGNOSIS — M5104 Intervertebral disc disorders with myelopathy, thoracic region: Secondary | ICD-10-CM | POA: Diagnosis not present

## 2023-05-29 DIAGNOSIS — H2513 Age-related nuclear cataract, bilateral: Secondary | ICD-10-CM | POA: Diagnosis not present

## 2023-05-29 DIAGNOSIS — H04123 Dry eye syndrome of bilateral lacrimal glands: Secondary | ICD-10-CM | POA: Diagnosis not present

## 2023-05-29 DIAGNOSIS — H0289 Other specified disorders of eyelid: Secondary | ICD-10-CM | POA: Diagnosis not present

## 2023-06-08 DIAGNOSIS — E7800 Pure hypercholesterolemia, unspecified: Secondary | ICD-10-CM | POA: Diagnosis not present

## 2023-06-08 DIAGNOSIS — E785 Hyperlipidemia, unspecified: Secondary | ICD-10-CM | POA: Diagnosis not present

## 2023-06-08 DIAGNOSIS — E1169 Type 2 diabetes mellitus with other specified complication: Secondary | ICD-10-CM | POA: Diagnosis not present

## 2023-06-08 DIAGNOSIS — I10 Essential (primary) hypertension: Secondary | ICD-10-CM | POA: Diagnosis not present

## 2023-06-22 DIAGNOSIS — E1169 Type 2 diabetes mellitus with other specified complication: Secondary | ICD-10-CM | POA: Diagnosis not present

## 2023-06-22 DIAGNOSIS — E785 Hyperlipidemia, unspecified: Secondary | ICD-10-CM | POA: Diagnosis not present

## 2023-06-22 DIAGNOSIS — I10 Essential (primary) hypertension: Secondary | ICD-10-CM | POA: Diagnosis not present

## 2023-06-22 DIAGNOSIS — E7800 Pure hypercholesterolemia, unspecified: Secondary | ICD-10-CM | POA: Diagnosis not present

## 2023-07-29 DIAGNOSIS — R399 Unspecified symptoms and signs involving the genitourinary system: Secondary | ICD-10-CM | POA: Diagnosis not present

## 2023-08-05 DIAGNOSIS — M5412 Radiculopathy, cervical region: Secondary | ICD-10-CM | POA: Diagnosis not present

## 2023-08-05 DIAGNOSIS — M5104 Intervertebral disc disorders with myelopathy, thoracic region: Secondary | ICD-10-CM | POA: Diagnosis not present

## 2023-08-12 ENCOUNTER — Ambulatory Visit: Payer: PPO | Admitting: Dermatology

## 2023-08-12 DIAGNOSIS — L309 Dermatitis, unspecified: Secondary | ICD-10-CM

## 2023-08-12 DIAGNOSIS — W908XXA Exposure to other nonionizing radiation, initial encounter: Secondary | ICD-10-CM | POA: Diagnosis not present

## 2023-08-12 DIAGNOSIS — Z79899 Other long term (current) drug therapy: Secondary | ICD-10-CM

## 2023-08-12 DIAGNOSIS — L570 Actinic keratosis: Secondary | ICD-10-CM | POA: Diagnosis not present

## 2023-08-12 DIAGNOSIS — L259 Unspecified contact dermatitis, unspecified cause: Secondary | ICD-10-CM

## 2023-08-12 DIAGNOSIS — L821 Other seborrheic keratosis: Secondary | ICD-10-CM

## 2023-08-12 DIAGNOSIS — L578 Other skin changes due to chronic exposure to nonionizing radiation: Secondary | ICD-10-CM

## 2023-08-12 DIAGNOSIS — Z1283 Encounter for screening for malignant neoplasm of skin: Secondary | ICD-10-CM

## 2023-08-12 DIAGNOSIS — D1801 Hemangioma of skin and subcutaneous tissue: Secondary | ICD-10-CM

## 2023-08-12 DIAGNOSIS — L814 Other melanin hyperpigmentation: Secondary | ICD-10-CM

## 2023-08-12 DIAGNOSIS — L820 Inflamed seborrheic keratosis: Secondary | ICD-10-CM

## 2023-08-12 DIAGNOSIS — Z7189 Other specified counseling: Secondary | ICD-10-CM

## 2023-08-12 DIAGNOSIS — D229 Melanocytic nevi, unspecified: Secondary | ICD-10-CM

## 2023-08-12 MED ORDER — MOMETASONE FUROATE 0.1 % EX CREA: TOPICAL_CREAM | CUTANEOUS | 1 refills | Status: AC

## 2023-08-12 NOTE — Progress Notes (Signed)
Follow-Up Visit   Subjective  Sara Rhodes is a 67 y.o. female who presents for the following: Skin Cancer Screening and Full Body Skin Exam  The patient presents for Total-Body Skin Exam (TBSE) for skin cancer screening and mole check. The patient has spots, moles and lesions to be evaluated, some may be new or changing and the patient may have concern these could be cancer.  The following portions of the chart were reviewed this encounter and updated as appropriate: medications, allergies, medical history  Review of Systems:  No other skin or systemic complaints except as noted in HPI or Assessment and Plan.  Objective  Well appearing patient in no apparent distress; mood and affect are within normal limits.  A full examination was performed including scalp, head, eyes, ears, nose, lips, neck, chest, axillae, abdomen, back, buttocks, bilateral upper extremities, bilateral lower extremities, hands, feet, fingers, toes, fingernails, and toenails. All findings within normal limits unless otherwise noted below.   Relevant physical exam findings are noted in the Assessment and Plan.  Hands x 5, face x 3 (8) Erythematous thin papules/macules with gritty scale.   R volar forearm x 2, L med breast x 2, scalp x 1, R med knee x 2 (2) Erythematous stuck-on, waxy papule or plaque    Assessment & Plan   SKIN CANCER SCREENING PERFORMED TODAY.  ACTINIC DAMAGE - Chronic condition, secondary to cumulative UV/sun exposure - diffuse scaly erythematous macules with underlying dyspigmentation - Recommend daily broad spectrum sunscreen SPF 30+ to sun-exposed areas, reapply every 2 hours as needed.  - Staying in the shade or wearing long sleeves, sun glasses (UVA+UVB protection) and wide brim hats (4-inch brim around the entire circumference of the hat) are also recommended for sun protection.  - Call for new or changing lesions.  LENTIGINES, SEBORRHEIC KERATOSES, HEMANGIOMAS - Benign  normal skin lesions - Benign-appearing - Call for any changes  MELANOCYTIC NEVI - Tan-brown and/or pink-flesh-colored symmetric macules and papules - Benign appearing on exam today - Observation - Call clinic for new or changing moles - Recommend daily use of broad spectrum spf 30+ sunscreen to sun-exposed areas.    AK (actinic keratosis) (8) Hands x 5, face x 3  Actinic keratoses are precancerous spots that appear secondary to cumulative UV radiation exposure/sun exposure over time. They are chronic with expected duration over 1 year. A portion of actinic keratoses will progress to squamous cell carcinoma of the skin. It is not possible to reliably predict which spots will progress to skin cancer and so treatment is recommended to prevent development of skin cancer.  Recommend daily broad spectrum sunscreen SPF 30+ to sun-exposed areas, reapply every 2 hours as needed.  Recommend staying in the shade or wearing long sleeves, sun glasses (UVA+UVB protection) and wide brim hats (4-inch brim around the entire circumference of the hat). Call for new or changing lesions.   Destruction of lesion - Hands x 5, face x 3 (8) Complexity: simple   Destruction method: cryotherapy   Informed consent: discussed and consent obtained   Timeout:  patient name, date of birth, surgical site, and procedure verified Lesion destroyed using liquid nitrogen: Yes   Region frozen until ice ball extended beyond lesion: Yes   Outcome: patient tolerated procedure well with no complications   Post-procedure details: wound care instructions given    Inflamed seborrheic keratosis (2) R volar forearm x 2, L med breast x 2, scalp x 1, R med knee x 2  Symptomatic, irritating, patient would like treated.   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 Condition is bothersome/symptomatic for patient. Currently flared.  Hand Dermatitis is a chronic type of eczema that can come and go on the hands and fingers.  While there is no cure, the rash and symptoms can be managed with topical prescription medications, and for more severe cases, with systemic medications.  Recommend mild soap and routine use of moisturizing cream after handwashing.  Minimize soap/water exposure when possible.    Treatment Plan Start Mometasone 0.01% cream QHS 5d/wk. Topical steroids (such as triamcinolone, fluocinolone, fluocinonide, mometasone, clobetasol, halobetasol, betamethasone, hydrocortisone) can cause thinning and lightening of the skin if they are used for too long in the same area. Your physician has selected the right strength medicine for your problem and area affected on the body. Please use your medication only as directed by your physician to prevent side effects.   Return in about 1 year (around 08/11/2024) for TBSE.  Sara Rhodes, CMA, am acting as scribe for Armida Sans, MD .   Documentation: I have reviewed the above documentation for accuracy and completeness, and I agree with the above.  Armida Sans, MD

## 2023-08-12 NOTE — Patient Instructions (Signed)

## 2023-08-18 ENCOUNTER — Encounter: Payer: Self-pay | Admitting: Dermatology

## 2023-08-27 DIAGNOSIS — M79601 Pain in right arm: Secondary | ICD-10-CM | POA: Diagnosis not present

## 2023-08-27 DIAGNOSIS — M4804 Spinal stenosis, thoracic region: Secondary | ICD-10-CM | POA: Diagnosis not present

## 2023-08-27 DIAGNOSIS — M5114 Intervertebral disc disorders with radiculopathy, thoracic region: Secondary | ICD-10-CM | POA: Diagnosis not present

## 2023-08-27 DIAGNOSIS — M5104 Intervertebral disc disorders with myelopathy, thoracic region: Secondary | ICD-10-CM | POA: Diagnosis not present

## 2023-08-27 DIAGNOSIS — M4802 Spinal stenosis, cervical region: Secondary | ICD-10-CM | POA: Diagnosis not present

## 2023-08-27 DIAGNOSIS — M5412 Radiculopathy, cervical region: Secondary | ICD-10-CM | POA: Diagnosis not present

## 2023-09-09 DIAGNOSIS — Z6841 Body Mass Index (BMI) 40.0 and over, adult: Secondary | ICD-10-CM | POA: Diagnosis not present

## 2023-09-09 DIAGNOSIS — M5104 Intervertebral disc disorders with myelopathy, thoracic region: Secondary | ICD-10-CM | POA: Diagnosis not present

## 2023-12-07 DIAGNOSIS — E785 Hyperlipidemia, unspecified: Secondary | ICD-10-CM | POA: Diagnosis not present

## 2023-12-07 DIAGNOSIS — J069 Acute upper respiratory infection, unspecified: Secondary | ICD-10-CM | POA: Diagnosis not present

## 2023-12-07 DIAGNOSIS — E1169 Type 2 diabetes mellitus with other specified complication: Secondary | ICD-10-CM | POA: Diagnosis not present

## 2023-12-07 DIAGNOSIS — I10 Essential (primary) hypertension: Secondary | ICD-10-CM | POA: Diagnosis not present

## 2024-01-01 DIAGNOSIS — E785 Hyperlipidemia, unspecified: Secondary | ICD-10-CM | POA: Diagnosis not present

## 2024-01-01 DIAGNOSIS — E1169 Type 2 diabetes mellitus with other specified complication: Secondary | ICD-10-CM | POA: Diagnosis not present

## 2024-01-01 DIAGNOSIS — E7800 Pure hypercholesterolemia, unspecified: Secondary | ICD-10-CM | POA: Diagnosis not present

## 2024-01-08 DIAGNOSIS — E7800 Pure hypercholesterolemia, unspecified: Secondary | ICD-10-CM | POA: Diagnosis not present

## 2024-01-08 DIAGNOSIS — E785 Hyperlipidemia, unspecified: Secondary | ICD-10-CM | POA: Diagnosis not present

## 2024-01-08 DIAGNOSIS — I10 Essential (primary) hypertension: Secondary | ICD-10-CM | POA: Diagnosis not present

## 2024-01-08 DIAGNOSIS — E559 Vitamin D deficiency, unspecified: Secondary | ICD-10-CM | POA: Diagnosis not present

## 2024-01-08 DIAGNOSIS — G8929 Other chronic pain: Secondary | ICD-10-CM | POA: Diagnosis not present

## 2024-01-08 DIAGNOSIS — M25562 Pain in left knee: Secondary | ICD-10-CM | POA: Diagnosis not present

## 2024-01-08 DIAGNOSIS — Z0000 Encounter for general adult medical examination without abnormal findings: Secondary | ICD-10-CM | POA: Diagnosis not present

## 2024-01-08 DIAGNOSIS — E1169 Type 2 diabetes mellitus with other specified complication: Secondary | ICD-10-CM | POA: Diagnosis not present

## 2024-01-12 DIAGNOSIS — M25562 Pain in left knee: Secondary | ICD-10-CM | POA: Diagnosis not present

## 2024-01-12 DIAGNOSIS — R2689 Other abnormalities of gait and mobility: Secondary | ICD-10-CM | POA: Diagnosis not present

## 2024-01-15 DIAGNOSIS — R2689 Other abnormalities of gait and mobility: Secondary | ICD-10-CM | POA: Diagnosis not present

## 2024-01-15 DIAGNOSIS — M25562 Pain in left knee: Secondary | ICD-10-CM | POA: Diagnosis not present

## 2024-01-19 DIAGNOSIS — R2689 Other abnormalities of gait and mobility: Secondary | ICD-10-CM | POA: Diagnosis not present

## 2024-01-19 DIAGNOSIS — M25562 Pain in left knee: Secondary | ICD-10-CM | POA: Diagnosis not present

## 2024-01-21 DIAGNOSIS — M25562 Pain in left knee: Secondary | ICD-10-CM | POA: Diagnosis not present

## 2024-01-21 DIAGNOSIS — R2689 Other abnormalities of gait and mobility: Secondary | ICD-10-CM | POA: Diagnosis not present

## 2024-01-27 DIAGNOSIS — R2689 Other abnormalities of gait and mobility: Secondary | ICD-10-CM | POA: Diagnosis not present

## 2024-01-27 DIAGNOSIS — M25562 Pain in left knee: Secondary | ICD-10-CM | POA: Diagnosis not present

## 2024-01-29 DIAGNOSIS — R2689 Other abnormalities of gait and mobility: Secondary | ICD-10-CM | POA: Diagnosis not present

## 2024-01-29 DIAGNOSIS — M25562 Pain in left knee: Secondary | ICD-10-CM | POA: Diagnosis not present

## 2024-02-01 DIAGNOSIS — R2689 Other abnormalities of gait and mobility: Secondary | ICD-10-CM | POA: Diagnosis not present

## 2024-02-01 DIAGNOSIS — M25562 Pain in left knee: Secondary | ICD-10-CM | POA: Diagnosis not present

## 2024-02-02 DIAGNOSIS — G8929 Other chronic pain: Secondary | ICD-10-CM | POA: Diagnosis not present

## 2024-02-02 DIAGNOSIS — M7051 Other bursitis of knee, right knee: Secondary | ICD-10-CM | POA: Diagnosis not present

## 2024-02-02 DIAGNOSIS — M1712 Unilateral primary osteoarthritis, left knee: Secondary | ICD-10-CM | POA: Diagnosis not present

## 2024-02-02 DIAGNOSIS — Z96651 Presence of right artificial knee joint: Secondary | ICD-10-CM | POA: Diagnosis not present

## 2024-02-04 DIAGNOSIS — M25562 Pain in left knee: Secondary | ICD-10-CM | POA: Diagnosis not present

## 2024-02-04 DIAGNOSIS — R2689 Other abnormalities of gait and mobility: Secondary | ICD-10-CM | POA: Diagnosis not present

## 2024-02-08 DIAGNOSIS — R2689 Other abnormalities of gait and mobility: Secondary | ICD-10-CM | POA: Diagnosis not present

## 2024-02-08 DIAGNOSIS — M25562 Pain in left knee: Secondary | ICD-10-CM | POA: Diagnosis not present

## 2024-02-12 DIAGNOSIS — R2689 Other abnormalities of gait and mobility: Secondary | ICD-10-CM | POA: Diagnosis not present

## 2024-02-12 DIAGNOSIS — M25562 Pain in left knee: Secondary | ICD-10-CM | POA: Diagnosis not present

## 2024-02-15 DIAGNOSIS — R2689 Other abnormalities of gait and mobility: Secondary | ICD-10-CM | POA: Diagnosis not present

## 2024-02-15 DIAGNOSIS — M25562 Pain in left knee: Secondary | ICD-10-CM | POA: Diagnosis not present

## 2024-02-22 DIAGNOSIS — M25562 Pain in left knee: Secondary | ICD-10-CM | POA: Diagnosis not present

## 2024-02-22 DIAGNOSIS — R2689 Other abnormalities of gait and mobility: Secondary | ICD-10-CM | POA: Diagnosis not present

## 2024-02-26 DIAGNOSIS — R2689 Other abnormalities of gait and mobility: Secondary | ICD-10-CM | POA: Diagnosis not present

## 2024-02-26 DIAGNOSIS — M25562 Pain in left knee: Secondary | ICD-10-CM | POA: Diagnosis not present

## 2024-03-03 DIAGNOSIS — Z96651 Presence of right artificial knee joint: Secondary | ICD-10-CM | POA: Diagnosis not present

## 2024-03-03 DIAGNOSIS — M7652 Patellar tendinitis, left knee: Secondary | ICD-10-CM | POA: Diagnosis not present

## 2024-03-03 DIAGNOSIS — M7041 Prepatellar bursitis, right knee: Secondary | ICD-10-CM | POA: Diagnosis not present

## 2024-03-03 DIAGNOSIS — M7051 Other bursitis of knee, right knee: Secondary | ICD-10-CM | POA: Diagnosis not present

## 2024-03-03 DIAGNOSIS — M7052 Other bursitis of knee, left knee: Secondary | ICD-10-CM | POA: Diagnosis not present

## 2024-03-09 DIAGNOSIS — M5104 Intervertebral disc disorders with myelopathy, thoracic region: Secondary | ICD-10-CM | POA: Diagnosis not present

## 2024-03-09 DIAGNOSIS — Z6841 Body Mass Index (BMI) 40.0 and over, adult: Secondary | ICD-10-CM | POA: Diagnosis not present

## 2024-03-29 DIAGNOSIS — H8111 Benign paroxysmal vertigo, right ear: Secondary | ICD-10-CM | POA: Diagnosis not present

## 2024-03-29 DIAGNOSIS — J302 Other seasonal allergic rhinitis: Secondary | ICD-10-CM | POA: Diagnosis not present

## 2024-04-06 DIAGNOSIS — E7800 Pure hypercholesterolemia, unspecified: Secondary | ICD-10-CM | POA: Diagnosis not present

## 2024-04-06 DIAGNOSIS — I10 Essential (primary) hypertension: Secondary | ICD-10-CM | POA: Diagnosis not present

## 2024-04-06 DIAGNOSIS — E785 Hyperlipidemia, unspecified: Secondary | ICD-10-CM | POA: Diagnosis not present

## 2024-04-06 DIAGNOSIS — E1169 Type 2 diabetes mellitus with other specified complication: Secondary | ICD-10-CM | POA: Diagnosis not present

## 2024-04-11 ENCOUNTER — Other Ambulatory Visit: Payer: Self-pay

## 2024-04-11 ENCOUNTER — Emergency Department

## 2024-04-11 ENCOUNTER — Emergency Department
Admission: EM | Admit: 2024-04-11 | Discharge: 2024-04-11 | Disposition: A | Discharge status: Home / Self Care | Attending: Emergency Medicine | Admitting: Emergency Medicine

## 2024-04-11 DIAGNOSIS — M25572 Pain in left ankle and joints of left foot: Secondary | ICD-10-CM | POA: Diagnosis not present

## 2024-04-11 DIAGNOSIS — I10 Essential (primary) hypertension: Secondary | ICD-10-CM | POA: Insufficient documentation

## 2024-04-11 DIAGNOSIS — M25551 Pain in right hip: Secondary | ICD-10-CM | POA: Insufficient documentation

## 2024-04-11 MED ORDER — LIDOCAINE 5 % EX PTCH: 1.0000 | MEDICATED_PATCH | CUTANEOUS | 0 refills | Status: AC

## 2024-04-11 MED ORDER — TRAMADOL HCL 50 MG PO TABS
50.0000 mg | ORAL_TABLET | Freq: Once | ORAL | Status: AC
  Administered 2024-04-11: 50 mg via ORAL
  Filled 2024-04-11: qty 1

## 2024-04-11 MED ORDER — TRAMADOL HCL 50 MG PO TABS: 50.0000 mg | ORAL_TABLET | Freq: Four times a day (QID) | ORAL | 0 refills | Status: AC | PRN

## 2024-04-11 MED ORDER — LIDOCAINE 5 % EX PTCH
1.0000 | MEDICATED_PATCH | CUTANEOUS | Status: DC
  Administered 2024-04-11: 1 via TRANSDERMAL
  Filled 2024-04-11: qty 1

## 2024-04-11 NOTE — ED Provider Notes (Signed)
 [facility] Provider Note    Event Date/Time   First MD Initiated Contact with Patient 04/11/24 2209     (approximate)   History   No chief complaint on file.   HPI  Sara Rhodes is a 68 y.o. female with PMH of hypertension, hyperlipidemia, arthritis presents evaluation of right hip pain.  Patient states that the pain began last night.  She was sitting on the toilet and lean to wipe when she felt a muscle pull.  She took Celebrex  and put Voltaren gel on which helped improve her pain.  She was able to get through her workday without too much pain but then it got worse when she got home.  No falls or trauma.  Pain does not radiate down the leg.      Physical Exam   Triage Vital Signs: ED Triage Vitals  Encounter Vitals Group     BP 04/11/24 1948 (!) 153/75     Systolic BP Percentile --      Diastolic BP Percentile --      Pulse Rate 04/11/24 1948 69     Resp 04/11/24 1948 19     Temp 04/11/24 1948 97.7 F (36.5 C)     Temp Source 04/11/24 1948 Oral     SpO2 04/11/24 1948 100 %     Weight 04/11/24 1954 225 lb (102.1 kg)     Height 04/11/24 1953 5\' 4"  (1.626 m)     Head Circumference --      Peak Flow --      Pain Score 04/11/24 1947 10     Pain Loc --      Pain Education --      Exclude from Growth Chart --     Most recent vital signs: Vitals:   04/11/24 1948  BP: (!) 153/75  Pulse: 69  Resp: 19  Temp: 97.7 F (36.5 C)  SpO2: 100%   General: Awake, no distress.  CV:  Good peripheral perfusion.  Resp:  Normal effort.  Abd:  No distention.  Other:  Tender to palpation over the right hip.  Hip range of motion well-maintained but does elicit pain.  Strength is equal in bilateral lower extremities.  Dorsalis pedis pulse 2+ and regular.  Sensation well-maintained.   ED Results / Procedures / Treatments   Labs (all labs ordered are listed, but only abnormal results are displayed) Labs Reviewed - No data to  display  RADIOLOGY  Right hip x-ray obtained, interpreted the images as well as reviewed radiologist report which was negative for any acute abnormalities.  PROCEDURES:  Critical Care performed: No  Procedures   MEDICATIONS ORDERED IN ED: Medications  lidocaine  (LIDODERM ) 5 % 1 patch (1 patch Transdermal Patch Applied 04/11/24 2305)  traMADol  (ULTRAM ) tablet 50 mg (50 mg Oral Given 04/11/24 2304)     IMPRESSION / MDM / ASSESSMENT AND PLAN / ED COURSE  I reviewed the triage vital signs and the nursing notes.                             68 year old female presents for evaluation of right hip pain.  Blood pressure is elevated otherwise vital signs are stable.  Patient NAD on exam.  Differential diagnosis includes, but is not limited to, muscle strain, hip fracture, hip dislocation, sciatica.  Patient's presentation is most consistent with acute complicated illness / injury requiring diagnostic workup.  Right hip x-ray is  negative.  Suspect muscle strain.  Discussed taking anti-inflammatories, Tylenol , lidocaine  patches, ice and heat.  Will send her with a short course of tramadol .  She was given a note for work.  Recommended she follow-up with orthopedics if her pain continues.  She voiced understanding, all questions were answered and she was stable at discharge.     FINAL CLINICAL IMPRESSION(S) / ED DIAGNOSES   Final diagnoses:  Right hip pain     Rx / DC Orders   ED Discharge Orders          Ordered    lidocaine  (LIDODERM ) 5 %  Every 24 hours        04/11/24 2303    traMADol  (ULTRAM ) 50 MG tablet  Every 6 hours PRN        04/11/24 2303             Note:  This document was prepared using Dragon voice recognition software and may include unintentional dictation errors.   Phyliss Breen, PA-C 04/11/24 2309    Kandee Orion, MD 04/11/24 [phone]

## 2024-04-11 NOTE — ED Triage Notes (Addendum)
 BIB [facility] from home with CC of R hip (buttocks) pain that started last night. Pt took Celebrex  and put Voltaren cream on R hip last night with relief. Slight pain this am that has continued to worsen throughout the day. Denies known injury/trauma. Denies hx of hip pain. Fall bracelet applied, shoes on pt, and pt in wheelchair.

## 2024-04-11 NOTE — Discharge Instructions (Addendum)
 The x-ray of your right hip did not show any fractures.  I believe your pain is coming from a muscle strain.  This is best treated with multimodal pain management.  Please continue to take the Celebrex  as prescribed.  You can take 650 mg of Tylenol  every 6 hours as needed for pain. You can use ice, heat, muscle creams and other topical pain relievers as well.  You can take the tramadol  every 6 hours as needed for severe pain.  This medication can be sedating so do not drive after taking it.  I also sent a prescription for lidocaine  patches.  Please follow-up with Dr. Hooten if your pain is not improving after a week.

## 2024-04-21 DIAGNOSIS — I10 Essential (primary) hypertension: Secondary | ICD-10-CM | POA: Diagnosis not present

## 2024-04-21 DIAGNOSIS — E1169 Type 2 diabetes mellitus with other specified complication: Secondary | ICD-10-CM | POA: Diagnosis not present

## 2024-04-21 DIAGNOSIS — M25551 Pain in right hip: Secondary | ICD-10-CM | POA: Diagnosis not present

## 2024-04-21 DIAGNOSIS — E7800 Pure hypercholesterolemia, unspecified: Secondary | ICD-10-CM | POA: Diagnosis not present

## 2024-04-21 DIAGNOSIS — E785 Hyperlipidemia, unspecified: Secondary | ICD-10-CM | POA: Diagnosis not present

## 2024-04-29 ENCOUNTER — Other Ambulatory Visit: Payer: Self-pay | Admitting: Orthopedic Surgery

## 2024-04-29 DIAGNOSIS — M5416 Radiculopathy, lumbar region: Secondary | ICD-10-CM

## 2024-05-03 ENCOUNTER — Ambulatory Visit
Admission: RE | Admit: 2024-05-03 | Discharge: 2024-05-03 | Disposition: A | Discharge status: Home / Self Care | Source: Ambulatory Visit | Attending: Orthopedic Surgery | Admitting: Orthopedic Surgery

## 2024-05-03 DIAGNOSIS — M5416 Radiculopathy, lumbar region: Secondary | ICD-10-CM | POA: Diagnosis not present

## 2024-05-03 DIAGNOSIS — M5127 Other intervertebral disc displacement, lumbosacral region: Secondary | ICD-10-CM | POA: Diagnosis not present

## 2024-05-03 DIAGNOSIS — M48061 Spinal stenosis, lumbar region without neurogenic claudication: Secondary | ICD-10-CM | POA: Diagnosis not present

## 2024-05-03 DIAGNOSIS — M5136 Other intervertebral disc degeneration, lumbar region with discogenic back pain only: Secondary | ICD-10-CM | POA: Diagnosis not present

## 2024-05-03 DIAGNOSIS — M47816 Spondylosis without myelopathy or radiculopathy, lumbar region: Secondary | ICD-10-CM | POA: Diagnosis not present

## 2024-05-19 DIAGNOSIS — M5416 Radiculopathy, lumbar region: Secondary | ICD-10-CM | POA: Diagnosis not present

## 2024-05-20 DIAGNOSIS — M5416 Radiculopathy, lumbar region: Secondary | ICD-10-CM | POA: Diagnosis not present

## 2024-06-20 DIAGNOSIS — M5416 Radiculopathy, lumbar region: Secondary | ICD-10-CM | POA: Diagnosis not present

## 2024-07-21 DIAGNOSIS — M5416 Radiculopathy, lumbar region: Secondary | ICD-10-CM | POA: Diagnosis not present

## 2024-08-11 ENCOUNTER — Ambulatory Visit: Payer: PPO | Admitting: Dermatology

## 2024-08-15 ENCOUNTER — Ambulatory Visit: Admitting: Dermatology

## 2024-08-15 DIAGNOSIS — L814 Other melanin hyperpigmentation: Secondary | ICD-10-CM | POA: Diagnosis not present

## 2024-08-15 DIAGNOSIS — L570 Actinic keratosis: Secondary | ICD-10-CM | POA: Diagnosis not present

## 2024-08-15 DIAGNOSIS — L821 Other seborrheic keratosis: Secondary | ICD-10-CM | POA: Diagnosis not present

## 2024-08-15 DIAGNOSIS — L820 Inflamed seborrheic keratosis: Secondary | ICD-10-CM | POA: Diagnosis not present

## 2024-08-15 DIAGNOSIS — L309 Dermatitis, unspecified: Secondary | ICD-10-CM

## 2024-08-15 DIAGNOSIS — Z1283 Encounter for screening for malignant neoplasm of skin: Secondary | ICD-10-CM | POA: Diagnosis not present

## 2024-08-15 DIAGNOSIS — R238 Other skin changes: Secondary | ICD-10-CM

## 2024-08-15 DIAGNOSIS — L578 Other skin changes due to chronic exposure to nonionizing radiation: Secondary | ICD-10-CM

## 2024-08-15 DIAGNOSIS — Z7189 Other specified counseling: Secondary | ICD-10-CM

## 2024-08-15 DIAGNOSIS — W908XXA Exposure to other nonionizing radiation, initial encounter: Secondary | ICD-10-CM | POA: Diagnosis not present

## 2024-08-15 DIAGNOSIS — L2089 Other atopic dermatitis: Secondary | ICD-10-CM

## 2024-08-15 DIAGNOSIS — D1801 Hemangioma of skin and subcutaneous tissue: Secondary | ICD-10-CM

## 2024-08-15 DIAGNOSIS — Z79899 Other long term (current) drug therapy: Secondary | ICD-10-CM

## 2024-08-15 DIAGNOSIS — D229 Melanocytic nevi, unspecified: Secondary | ICD-10-CM

## 2024-08-15 DIAGNOSIS — I781 Nevus, non-neoplastic: Secondary | ICD-10-CM | POA: Diagnosis not present

## 2024-08-15 NOTE — Progress Notes (Unsigned)
 Follow-Up Visit   Subjective  Sara Rhodes is a 68 y.o. female who presents for the following: Skin Cancer Screening and Full Body Skin Exam Hx of aks, hx of isk  Spot at right forearm she noticed 3 to 4 months  Spot at left jaw that she picks at [facility] at spot at right jaw and under right chin that she picks Right cheek a dry scaly spot and right side of nose  Ak right forearm volar mid recheck at next follow up  If not resolve  The patient presents for Total-Body Skin Exam (TBSE) for skin cancer screening and mole check. The patient has spots, moles and lesions to be evaluated, some may be new or changing and the patient may have concern these could be cancer.    The following portions of the chart were reviewed this encounter and updated as appropriate: medications, allergies, medical history  Review of Systems:  No other skin or systemic complaints except as noted in HPI or Assessment and Plan.  Objective  Well appearing patient in no apparent distress; mood and affect are within normal limits.  A full examination was performed including scalp, head, eyes, ears, nose, lips, neck, chest, axillae, abdomen, back, buttocks, bilateral upper extremities, bilateral lower extremities, hands, feet, fingers, toes, fingernails, and toenails. All findings within normal limits unless otherwise noted below.   Relevant physical exam findings are noted in the Assessment and Plan.    Assessment & Plan   SKIN CANCER SCREENING PERFORMED TODAY.  ACTINIC DAMAGE - Chronic condition, secondary to cumulative UV/sun exposure - diffuse scaly erythematous macules with underlying dyspigmentation - Recommend daily broad spectrum sunscreen SPF 30+ to sun-exposed areas, reapply every 2 hours as needed.  - Staying in the shade or wearing long sleeves, sun glasses (UVA+UVB protection) and wide brim hats (4-inch brim around the entire circumference of the hat) are also recommended for sun  protection.  - Call for new or changing lesions.  LENTIGINES, SEBORRHEIC KERATOSES, HEMANGIOMAS - Benign normal skin lesions - Benign-appearing - Call for any changes  MELANOCYTIC NEVI - Tan-brown and/or pink-flesh-colored symmetric macules and papules - Benign appearing on exam today - Observation - Call clinic for new or changing moles - Recommend daily use of broad spectrum spf 30+ sunscreen to sun-exposed areas.   [place] Exam: red or purple papule at Right lower lip of midline   Treatment Plan: Benign-appearing. 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 Condition is bothersome/symptomatic for patient. Currently flared.   Hand Dermatitis is a chronic type of eczema that can come and go on the hands and fingers.  While there is no cure, the rash and symptoms can be managed with topical prescription medications, and for more severe cases, with systemic medications.  Recommend mild soap and routine use of moisturizing cream after handwashing.  Minimize soap/water exposure when possible.     Treatment Plan  Consider patch testing   Hand dermatitis a   Anzupgo cream 2 % - apply to affected areas at hands  [id number] 05/2027 Start Mometasone  0.01% cream QHS 5d/wk. Topical steroids (such as triamcinolone , fluocinolone, fluocinonide, mometasone , clobetasol, halobetasol, betamethasone, hydrocortisone) can cause thinning and lightening of the skin if they are used for too long in the same area. Your physician has selected the right strength medicine for your  problem and area affected on the body. Please use your medication only as directed by your physician to prevent side effects.      No follow-ups on file.  IEleanor Blush, CMA, am acting as scribe for Alm Rhyme, MD.   Documentation: I have  reviewed the above documentation for accuracy and completeness, and I agree with the above.  Alm Rhyme, MD

## 2024-08-15 NOTE — Patient Instructions (Addendum)
 If spot at right forearm doesn't go away 2 months let us  know     Cryotherapy Aftercare  Wash gently with soap and water everyday.   Apply Vaseline and Band-Aid daily until healed.   Actinic keratoses are precancerous spots that appear secondary to cumulative UV radiation exposure/sun exposure over time. They are chronic with expected duration over 1 year. A portion of actinic keratoses will progress to squamous cell carcinoma of the skin. It is not possible to reliably predict which spots will progress to skin cancer and so treatment is recommended to prevent development of skin cancer.  Recommend daily broad spectrum sunscreen SPF 30+ to sun-exposed areas, reapply every 2 hours as needed.  Recommend staying in the shade or wearing long sleeves, sun glasses (UVA+UVB protection) and wide brim hats (4-inch brim around the entire circumference of the hat). Call for new or changing lesions.    Seborrheic Keratosis  What causes seborrheic keratoses? Seborrheic keratoses are harmless, common skin growths that first appear during adult life.  As time goes by, more growths appear.  Some people may develop a large number of them.  Seborrheic keratoses appear on both covered and uncovered body parts.  They are not caused by sunlight.  The tendency to develop seborrheic keratoses can be inherited.  They vary in color from skin-colored to gray, brown, or even black.  They can be either smooth or have a rough, warty surface.   Seborrheic keratoses are superficial and look as if they were stuck on the skin.  Under the microscope this type of keratosis looks like layers upon layers of skin.  That is why at times the top layer may seem to fall off, but the rest of the growth remains and re-grows.    Treatment Seborrheic keratoses do not need to be treated, but can easily be removed in the office.  Seborrheic keratoses often cause symptoms when they rub on clothing or jewelry.  Lesions can be in the way of  shaving.  If they become inflamed, they can cause itching, soreness, or burning.  Removal of a seborrheic keratosis can be accomplished by freezing, burning, or surgery. If any spot bleeds, scabs, or grows rapidly, please return to have it checked, as these can be an indication of a skin cancer.  For hand dermatitis / eczema  Start anzupgo cream 2 % - apply topically to affected areas on hands daily  Sample given today   Your prescription was sent to [facility] in [place]. A representative from [facility] will contact you within 3 business hours to verify your address and insurance information to schedule a free delivery. If for any reason you do not receive a phone call from them, please reach out to them. Their phone number is [phone] and their hours are Monday-Friday 9:00 am-5:00 pm.      Melanoma ABCDEs  Melanoma is the most dangerous type of skin cancer, and is the leading cause of death from skin disease.  You are more likely to develop melanoma if you: Have light-colored skin, light-colored eyes, or red or blond hair Spend a lot of time in the sun Tan regularly, either outdoors or in a tanning bed Have had blistering sunburns, especially during childhood Have a close family member who has had a melanoma Have atypical moles or large birthmarks  Early detection of melanoma is key since treatment is typically straightforward and cure rates are extremely high if we catch it early.   The first sign of  melanoma is often a change in a mole or a new dark spot.  The ABCDE system is a way of remembering the signs of melanoma.  A for asymmetry:  The two halves do not match. B for border:  The edges of the growth are irregular. C for color:  A mixture of colors are present instead of an even brown color. D for diameter:  Melanomas are usually (but not always) greater than 6mm - the size of a pencil eraser. E for evolution:  The spot keeps changing in size, shape, and  color.  Please check your skin once per month between visits. You can use a small mirror in front and a large mirror behind you to keep an eye on the back side or your body.   If you see any new or changing lesions before your next follow-up, please call to schedule a visit.  Please continue daily skin protection including broad spectrum sunscreen SPF 30+ to sun-exposed areas, reapplying every 2 hours as needed when you're outdoors.   Staying in the shade or wearing long sleeves, sun glasses (UVA+UVB protection) and wide brim hats (4-inch brim around the entire circumference of the hat) are also recommended for sun protection.    Due to recent changes in healthcare laws, you may see results of your pathology and/or laboratory studies on MyChart before the doctors have had a chance to review them. We understand that in some cases there may be results that are confusing or concerning to you. Please understand that not all results are received at the same time and often the doctors may need to interpret multiple results in order to provide you with the best plan of care or course of treatment. Therefore, we ask that you please give us  2 business days to thoroughly review all your results before contacting the office for clarification. Should we see a critical lab result, you will be contacted sooner.   If You Need Anything After Your Visit  If you have any questions or concerns for your doctor, please call our main line at [phone] and press option 4 to reach your doctor's medical assistant. If no one answers, please leave a voicemail as directed and we will return your call as soon as possible. Messages left after 4 pm will be answered the following business day.   You may also send us  a message via MyChart. We typically respond to MyChart messages within 1-2 business days.  For prescription refills, please ask your pharmacy to contact our office. Our fax number is [phone].  If you have  an urgent issue when the clinic is closed that cannot wait until the next business day, you can page your doctor at the number below.    Please note that while we do our best to be available for urgent issues outside of office hours, we are not available 24/7.   If you have an urgent issue and are unable to reach us , you may choose to seek medical care at your doctor's office, retail clinic, urgent care center, or emergency room.  If you have a medical emergency, please immediately call 911 or go to the emergency department.  Pager Numbers  - Dr. Hester: [phone]  - Dr. Jackquline: [phone]  - Dr. Claudene: [phone]   - Dr. Raymund: [phone]  In the event of inclement weather, please call our main line at [phone] for an update on the status of any delays or closures.  Dermatology Medication Tips: Please keep the  boxes that topical medications come in in order to help keep track of the instructions about where and how to use these. Pharmacies typically print the medication instructions only on the boxes and not directly on the medication tubes.   If your medication is too expensive, please contact our office at [phone] option 4 or send us  a message through MyChart.   We are unable to tell what your co-pay for medications will be in advance as this is different depending on your insurance coverage. However, we may be able to find a substitute medication at lower cost or fill out paperwork to get insurance to cover a needed medication.   If a prior authorization is required to get your medication covered by your insurance company, please allow us  1-2 business days to complete this process.  Drug prices often vary depending on where the prescription is filled and some pharmacies may offer cheaper prices.  The website www.goodrx.com contains coupons for medications through different pharmacies. The prices here do not account for what the cost may be with help from  insurance (it may be cheaper with your insurance), but the website can give you the price if you did not use any insurance.  - You can print the associated coupon and take it with your prescription to the pharmacy.  - You may also stop by our office during regular business hours and pick up a GoodRx coupon card.  - If you need your prescription sent electronically to a different pharmacy, notify our office through [facility] or by phone at [phone] option 4.     Si Usted Necesita Algo Despus de Su Visita  Tambin puede enviarnos un mensaje a travs de Clinical cytogeneticist. Por lo general respondemos a los mensajes de MyChart en el transcurso de 1 a 2 das hbiles.  Para renovar recetas, por favor pida a su farmacia que se ponga en contacto con nuestra oficina. Randi lakes de fax es [place] [phone].  Si tiene un asunto urgente cuando la clnica est cerrada y que no puede esperar hasta el siguiente da hbil, puede llamar/localizar a su doctor(a) al nmero que aparece a continuacin.   Por favor, tenga en cuenta que aunque hacemos todo lo posible para estar disponibles para asuntos urgentes fuera del horario de [place], no estamos disponibles las 24 horas del da, los 7 [address]  [address] de la [place].   Si tiene un problema urgente y no puede comunicarse con nosotros, puede optar por buscar atencin mdica  en el consultorio de su doctor(a), en una clnica privada, en un centro de atencin urgente o en una sala de emergencias.  Si tiene Engineer, drilling, por favor llame inmediatamente al 911 o vaya a la sala de emergencias.  Nmeros de bper  - Dr. Hester: [phone]  - Dra. Jackquline: [phone]  - Dr. Claudene: [phone]  - Dra. Kitts: [phone]  En caso de inclemencias del [place], por favor llame a nuestra lnea principal al [phone] para una actualizacin sobre el estado de cualquier retraso o cierre.  Consejos para la medicacin en dermatologa: Por favor, guarde las  cajas en las que vienen los medicamentos de uso tpico para ayudarle a seguir las instrucciones sobre dnde y cmo usarlos. Las farmacias generalmente imprimen las instrucciones del medicamento slo en las cajas y no directamente en los tubos del [place].   Si su medicamento es muy caro, por favor, pngase en contacto con landry rieger llamando al [phone] y presione la opcin 4 o envenos un mensaje  a travs de MyChart.   No podemos decirle cul ser su copago por los medicamentos por adelantado ya que esto es diferente dependiendo de la cobertura de su seguro. Sin embargo, es posible que podamos encontrar un medicamento sustituto a Audiological scientist un formulario para que el seguro cubra el medicamento que se considera necesario.   Si se requiere una autorizacin previa para que su compaa de seguros malta su medicamento, por favor permtanos de 1 a 2 das hbiles para completar este proceso.  Los precios de los medicamentos varan con frecuencia dependiendo del Environmental consultant de dnde se surte la receta y alguna farmacias pueden ofrecer precios ms baratos.  El sitio web www.goodrx.com tiene cupones para medicamentos de Health and safety inspector. Los precios aqu no tienen en cuenta lo que podra costar con la ayuda del seguro (puede ser ms barato con su seguro), pero el sitio web puede darle el precio si no utiliz Tourist information centre manager.  - Puede imprimir el cupn correspondiente y llevarlo con su receta a la farmacia.  - Tambin puede pasar por nuestra oficina durante el horario de atencin regular y Education officer, museum una tarjeta de cupones de GoodRx.  - Si necesita que su receta se enve electrnicamente a una farmacia diferente, informe a nuestra oficina a travs de MyChart de [place] o por telfono llamando al [phone] y presione la opcin 4.

## 2024-08-16 ENCOUNTER — Encounter: Payer: Self-pay | Admitting: Dermatology

## 2024-08-16 MED ORDER — ANZUPGO 20 MG/GM EX CREA: 1.0000 | TOPICAL_CREAM | Freq: Every day | CUTANEOUS | 6 refills | Status: AC

## 2024-12-12 ENCOUNTER — Ambulatory Visit: Admitting: Dermatology
